# Patient Record
Sex: Male | Born: 1945 | Race: White | Hispanic: No | Marital: Single | Smoking: Current every day smoker
Health system: Southern US, Community
[De-identification: ages and names within clinical notes are randomized; demographics above are authoritative.]

## PROBLEM LIST (undated history)

## (undated) DIAGNOSIS — I10 Essential (primary) hypertension: Secondary | ICD-10-CM

## (undated) DIAGNOSIS — N400 Enlarged prostate without lower urinary tract symptoms: Secondary | ICD-10-CM

## (undated) DIAGNOSIS — R972 Elevated prostate specific antigen [PSA]: Secondary | ICD-10-CM

## (undated) DIAGNOSIS — E785 Hyperlipidemia, unspecified: Secondary | ICD-10-CM

## (undated) DIAGNOSIS — I639 Cerebral infarction, unspecified: Secondary | ICD-10-CM

## (undated) DIAGNOSIS — I679 Cerebrovascular disease, unspecified: Secondary | ICD-10-CM

## (undated) DIAGNOSIS — I6529 Occlusion and stenosis of unspecified carotid artery: Secondary | ICD-10-CM

## (undated) HISTORY — DX: Benign prostatic hyperplasia without lower urinary tract symptoms: N40.0

## (undated) HISTORY — DX: Essential (primary) hypertension: I10

## (undated) HISTORY — DX: Hyperlipidemia, unspecified: E78.5

## (undated) HISTORY — DX: Elevated prostate specific antigen (PSA): R97.20

## (undated) HISTORY — DX: Cerebral infarction, unspecified: I63.9

## (undated) HISTORY — DX: Occlusion and stenosis of unspecified carotid artery: I65.29

## (undated) HISTORY — PX: OTHER SURGICAL HISTORY: SHX169

## (undated) HISTORY — DX: Cerebrovascular disease, unspecified: I67.9

## (undated) HISTORY — PX: PROSTATE SURGERY: SHX751

---

## 1998-05-04 ENCOUNTER — Encounter: Payer: Self-pay | Admitting: Emergency Medicine

## 1998-05-04 ENCOUNTER — Inpatient Hospital Stay (HOSPITAL_COMMUNITY): Admission: EM | Admit: 1998-05-04 | Discharge: 1998-05-08 | Payer: Self-pay | Admitting: Emergency Medicine

## 1998-05-05 ENCOUNTER — Encounter: Payer: Self-pay | Admitting: Emergency Medicine

## 2008-04-28 ENCOUNTER — Ambulatory Visit: Payer: Self-pay | Admitting: Internal Medicine

## 2009-01-30 ENCOUNTER — Inpatient Hospital Stay: Payer: Self-pay | Admitting: Internal Medicine

## 2010-10-21 DIAGNOSIS — N400 Enlarged prostate without lower urinary tract symptoms: Secondary | ICD-10-CM

## 2010-10-21 DIAGNOSIS — I10 Essential (primary) hypertension: Secondary | ICD-10-CM

## 2010-10-21 DIAGNOSIS — I679 Cerebrovascular disease, unspecified: Secondary | ICD-10-CM | POA: Insufficient documentation

## 2010-10-21 DIAGNOSIS — E785 Hyperlipidemia, unspecified: Secondary | ICD-10-CM | POA: Insufficient documentation

## 2010-10-21 HISTORY — DX: Benign prostatic hyperplasia without lower urinary tract symptoms: N40.0

## 2010-10-21 HISTORY — DX: Cerebrovascular disease, unspecified: I67.9

## 2010-10-21 HISTORY — DX: Essential (primary) hypertension: I10

## 2012-06-05 DIAGNOSIS — I6529 Occlusion and stenosis of unspecified carotid artery: Secondary | ICD-10-CM

## 2012-06-05 HISTORY — DX: Occlusion and stenosis of unspecified carotid artery: I65.29

## 2014-09-08 ENCOUNTER — Other Ambulatory Visit: Payer: Self-pay | Admitting: Urology

## 2014-09-08 ENCOUNTER — Other Ambulatory Visit: Payer: Self-pay

## 2014-09-08 DIAGNOSIS — N400 Enlarged prostate without lower urinary tract symptoms: Secondary | ICD-10-CM

## 2014-09-08 MED ORDER — AVODART 0.5 MG PO CAPS: 0.5000 mg | ORAL_CAPSULE | Freq: Every day | ORAL | Status: DC

## 2014-09-08 NOTE — Progress Notes (Signed)
Pt called requesting avodart brand name and not dutasteride generic. Brand name medication was sent to pharmacy.

## 2015-01-27 ENCOUNTER — Telehealth: Payer: Self-pay | Admitting: Urology

## 2015-01-27 NOTE — Telephone Encounter (Signed)
Patient called and left a voice mail on 12/26 requesting a call back regarding his Avodart prescription.

## 2015-01-29 NOTE — Telephone Encounter (Signed)
PA has been sent to pt insurance company.

## 2015-05-11 ENCOUNTER — Encounter: Payer: Self-pay | Admitting: *Deleted

## 2015-05-14 ENCOUNTER — Ambulatory Visit: Payer: Self-pay

## 2015-05-25 ENCOUNTER — Ambulatory Visit (INDEPENDENT_AMBULATORY_CARE_PROVIDER_SITE_OTHER): Payer: Medicare Other | Admitting: Urology

## 2015-05-25 ENCOUNTER — Encounter: Payer: Self-pay | Admitting: Urology

## 2015-05-25 VITALS — BP 132/76 | HR 80 | Ht 68.0 in | Wt 175.0 lb

## 2015-05-25 DIAGNOSIS — Z87898 Personal history of other specified conditions: Secondary | ICD-10-CM | POA: Diagnosis not present

## 2015-05-25 DIAGNOSIS — N401 Enlarged prostate with lower urinary tract symptoms: Secondary | ICD-10-CM

## 2015-05-25 DIAGNOSIS — N138 Other obstructive and reflux uropathy: Secondary | ICD-10-CM

## 2015-05-25 NOTE — Progress Notes (Signed)
05/25/2015 2:01 PM   Barry Hale [birth year] [id number]  Referring provider: No referring provider defined for this encounter.  Chief Complaint  Patient presents with  . Benign Prostatic Hypertrophy    HPI: Patient is a 70 year old Caucasian male with a history of BPH and LUTS and an elevated PSA who presents today for a 1 year follow-up.   BPH WITH LUTS His IPSS score today is 1, which is mild lower urinary tract symptomatology. He is pleased with his quality life due to his urinary symptoms.  He denies any dysuria, hematuria or suprapubic pain.   He has not been taking his Avodart since November 2016, as his insurance would not cover the medication.  He also denies any recent fevers, chills, nausea or vomiting.  He does not have a family history of PCa.      IPSS      05/25/15 1300       International Prostate Symptom Score   How often have you had the sensation of not emptying your bladder? Not at All     How often have you had to urinate less than every two hours? Not at All     How often have you found you stopped and started again several times when you urinated? Not at All     How often have you found it difficult to postpone urination? Not at All     How often have you had a weak urinary stream? Not at All     How often have you had to strain to start urination? Not at All     How many times did you typically get up at night to urinate? 1 Time     Total IPSS Score 1     Quality of Life due to urinary symptoms   If you were to spend the rest of your life with your urinary condition just the way it is now how would you feel about that? Pleased        Score:  1-7 Mild 8-19 Moderate 20-35 Severe  History of elevated PSA Patient underwent a biopsy of the prostate 2012 for a PSA of 7.6 ng/mL. The biopsy was negative.  He had been on dutasteride until November 2016. His most recent PSA was 3.2 ng/mL on 05/15/2014.   PMH: Past Medical History  Diagnosis  Date  . HTN (hypertension)   . HLD (hyperlipidemia)   . Stroke (cerebrum) (HCC)   . BPH (benign prostatic hyperplasia)   . Elevated PSA   . 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 Asymptomatic carotid artery stenosis 06/05/2012  . BP (high blood pressure) 10/21/2010  . 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 Artery blockage    . Prostate surgery      Home Medications:    Medication List       This list is accurate as of: 05/25/15  2:01 PM.  Always use your most recent med list.               amLODipine 5 MG tablet  Commonly known as:  NORVASC     atorvastatin 40 MG tablet  Commonly known as:  LIPITOR  TK 1 T PO QD     KLOR-CON M10 10 MEQ tablet  Generic drug:  potassium chloride     lisinopril-hydrochlorothiazide 20-12.5 MG tablet  Commonly known as:  PRINZIDE,ZESTORETIC  TK 1 T PO QD        Allergies: No Known Allergies  Family History: Family History  Problem Relation Age of Onset  . Benign prostatic hyperplasia    . Coronary artery disease      Social History:  reports that he has been smoking.  He does not have any smokeless tobacco history on file. He reports that he drinks alcohol. His drug history is not on file.  ROS: UROLOGY Frequent Urination?: No Hard to postpone urination?: No Burning/pain with urination?: No Get up at night to urinate?: No Leakage of urine?: No Urine stream starts and stops?: No Trouble starting stream?: No Do you have to strain to urinate?: No Blood in urine?: No Urinary tract infection?: No Sexually transmitted disease?: No Injury to kidneys or bladder?: No Painful intercourse?: No Weak stream?: No Erection problems?: No Penile pain?: No  Gastrointestinal Nausea?: No Vomiting?:  No Indigestion/heartburn?: No Diarrhea?: Yes Constipation?: No  Constitutional Fever: No Night sweats?: No Weight loss?: No Fatigue?: No  Skin Skin rash/lesions?: No Itching?: No  Eyes Blurred vision?: No Double vision?: No  Ears/Nose/Throat Sore throat?: No Sinus problems?: No  Hematologic/Lymphatic Swollen glands?: No Easy bruising?: No  Cardiovascular Leg swelling?: No Chest pain?: No  Respiratory Cough?: No Shortness of breath?: No  Endocrine Excessive thirst?: No  Musculoskeletal Back pain?: No Joint pain?: No  Neurological Headaches?: No Dizziness?: No  Psychologic Depression?: No Anxiety?: No  Physical Exam: BP 132/76 mmHg  Pulse 80  Ht 5\' 8"  (1.727 m)  Wt 175 lb (79.379 kg)  BMI 26.61 kg/m2  Constitutional: Well nourished. Alert and oriented, No acute distress. HEENT: [place] AT, moist mucus membranes. Trachea midline, no masses. Cardiovascular: No clubbing, cyanosis, or edema. Respiratory: Normal respiratory effort, no increased work of breathing. GI: Abdomen is soft, non tender, non distended, no abdominal masses. Liver and spleen not palpable.  No hernias appreciated.  Stool sample for occult testing is not indicated.   GU: No CVA tenderness.  No bladder fullness or masses.  Patient with circumcised phallus.  Urethral meatus is patent.  No penile discharge. No penile lesions or rashes. Scrotum without lesions, cysts, rashes and/or edema.  Testicles are located scrotally bilaterally. No masses are appreciated in the testicles. Left and right epididymis are normal. Rectal: Patient with  normal sphincter tone. Anus and perineum without scarring or rashes. No rectal masses are appreciated. Prostate is approximately 60 grams, no nodules are appreciated. Seminal vesicles are normal. Skin: No rashes, bruises or suspicious lesions. Lymph: No cervical or inguinal adenopathy. Neurologic: Grossly intact, no focal deficits, moving all 4  extremities. Psychiatric: Normal mood and affect.  Laboratory Data: PSA history  7.6 ng/mL 2012 with negative biopsy; initiated on dutasteride at that time  2.6 ng/mL on 05/03/2012  3.0 ng/mL on 11/13/2012  3.4 ng/mL on 05/14/2013  2.9 ng/mL on 11/13/2013  3.2 ng/mL on 05/15/2014; patient has been without dutasteride since 12/2014    Assessment & Plan:    1. BPH (benign prostatic hyperplasia) with LUTS:   IPSS score is 1/1.  Patient would like to defer his refill of dutasteride at this time until his PSA result is available.   If his PSA result remains at baseline, he chooses not to refill the medication and he'll return in 1 year for I PSS score, PSA and exam.   If the PSA returns elevated, we  will restart the dutasteride and recheck his PSA in 3 months.   - PSA  2. History of elevated PSA:   Patient underwent a biopsy of the prostate 2012 for a PSA of 7.6 ng/mL. The biopsy was negative.  He had been on dutasteride until November 2016. His most recent PSA was 3.2 ng/mL on 05/15/2014.  PSA is drawn today.  See above for plan.   Return in about 1 year (around 05/24/2016) for IPSS and exam.  These notes generated with voice recognition software. I apologize for typographical errors.  Michiel Cowboy, PA-C  [facility] [address], [address] [place], [place] [address] [phone]

## 2015-05-26 DIAGNOSIS — N138 Other obstructive and reflux uropathy: Secondary | ICD-10-CM | POA: Insufficient documentation

## 2015-05-26 DIAGNOSIS — Z87898 Personal history of other specified conditions: Secondary | ICD-10-CM | POA: Insufficient documentation

## 2015-05-26 DIAGNOSIS — N401 Enlarged prostate with lower urinary tract symptoms: Principal | ICD-10-CM

## 2015-05-26 LAB — PSA: Prostate Specific Ag, Serum: 3.1 ng/mL (ref 0.0–4.0)

## 2015-05-28 ENCOUNTER — Telehealth: Payer: Self-pay

## 2015-05-28 NOTE — Telephone Encounter (Signed)
-----   Message from Harle BattiestShannon A McGowan, PA-C sent at 05/26/2015  5:00 PM EDT ----- PSA is stable.  We will see him in one year.

## 2015-05-28 NOTE — Telephone Encounter (Signed)
LMOM- recent labs are stable. See in [id number].

## 2016-05-11 ENCOUNTER — Ambulatory Visit (INDEPENDENT_AMBULATORY_CARE_PROVIDER_SITE_OTHER): Payer: Medicare Other | Admitting: Urology

## 2016-05-11 ENCOUNTER — Encounter: Payer: Self-pay | Admitting: Urology

## 2016-05-11 VITALS — BP 85/55 | HR 103 | Ht 67.0 in | Wt 174.3 lb

## 2016-05-11 DIAGNOSIS — N400 Enlarged prostate without lower urinary tract symptoms: Secondary | ICD-10-CM

## 2016-05-11 DIAGNOSIS — R339 Retention of urine, unspecified: Secondary | ICD-10-CM

## 2016-05-11 LAB — BLADDER SCAN AMB NON-IMAGING: Scan Result: 700

## 2016-05-11 MED ORDER — DUTASTERIDE 0.5 MG PO CAPS: 0.5000 mg | ORAL_CAPSULE | Freq: Every day | ORAL | 11 refills | Status: DC

## 2016-05-11 MED ORDER — TAMSULOSIN HCL 0.4 MG PO CAPS: 0.4000 mg | ORAL_CAPSULE | Freq: Every day | ORAL | 11 refills | Status: DC

## 2016-05-11 NOTE — Progress Notes (Signed)
Simple Catheter Placement  Due to urinary retention patient is present today for a foley cath placement.  Patient was cleaned and prepped in a sterile fashion with betadine and lidocaine jelly 2% was instilled into the urethra.  A 16 FR council tip foley catheter was inserted, urine return was noted  700 ml, urine was dark yellow in color.  The balloon was filled with 10cc of sterile water.  A night bag was attached for drainage. Patient was also given a leg bag to take home and was given instruction on how to change from one bag to another.  Patient was given instruction on proper catheter care.  Patient tolerated well, no complications were noted   Preformed by: K.Sunni Richardson,CMA  Additional notes/ Follow up: 1-2 week

## 2016-05-11 NOTE — Progress Notes (Signed)
05/11/2016 2:29 PM   Barry Hale 1945-08-26 [id number]  Referring provider: Fonnie Mu, MD No address on file  No chief complaint on file. URINARY RETENTION  HPI: Pt reports inability to void since 4 AM. He has an urge to void and cannot. No alleviating or aggravating factors. No associated signs or symptoms. Symptoms are persistent and worsening. Bladder scan is around 700 ml.   He has a long h/o BPH. He has a h/o retention around 2010 or 2011 and underwent TUMT with Dr. Orson Slick. He was on Avodart but stopped in 2016 when insurance no longer covered (maybe because it was generic?). He had a biopsy in 2012 but the note is not readily available to determine what his prostate measured.   Patient underwent a biopsy of the prostate 2012 for a PSA of 7.6 ng/mL. The biopsy was negative.  He had been on dutasteride until November 2016. His PSA was 3.2 ng/mL on 05/15/2014 and 3.02 May 2015 with a normal DRE.    PMH: Past Medical History:  Diagnosis Date  . 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 Asymptomatic carotid artery stenosis 06/05/2012  . 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 BP (high blood pressure) 10/21/2010  . BPH (benign prostatic hyperplasia)   . Elevated PSA   . HLD (hyperlipidemia)   . HTN (hypertension)   . Stroke (cerebrum) [facility])     Surgical History: Past Surgical History:  Procedure Laterality Date  . artery blockage    . PROSTATE SURGERY      Home Medications:  Allergies as of 05/11/2016   No Known Allergies     Medication List       Accurate as of 05/11/16  2:29 PM. Always use your most recent med list.          amLODipine 5 MG tablet Commonly known as:  NORVASC   atorvastatin 40 MG tablet Commonly known as:  LIPITOR TK 1 T PO QD   KLOR-CON M10 10 MEQ tablet Generic drug:   potassium chloride   lisinopril-hydrochlorothiazide 20-12.5 MG tablet Commonly known as:  PRINZIDE,ZESTORETIC TK 1 T PO QD       Allergies: No Known Allergies  Family History: Family History  Problem Relation Age of Onset  . Benign prostatic hyperplasia    . Coronary artery disease      Social History:  reports that he has been smoking.  He does not have any smokeless tobacco history on file. He reports that he drinks alcohol. His drug history is not on file.  ROS:                                        Physical Exam: There were no vitals taken for this visit.  Constitutional:  Alert and oriented, No acute distress. HEENT: [place] AT, moist mucus membranes.  Trachea midline, no masses. Cardiovascular: No clubbing, cyanosis, or edema. Respiratory: Normal respiratory effort, no increased work of breathing. GI: Abdomen is soft, nontender, nondistended, no abdominal masses Skin: No rashes, bruises or suspicious lesions. Neurologic: Grossly intact, no focal deficits, moving all 4 extremities. Psychiatric: Normal mood and affect.  Laboratory Data: No results found for: WBC, HGB, HCT, MCV, PLT  No results found for: CREATININE  No results found for:  PSA  No results found for: TESTOSTERONE  No results found for: HGBA1C  Urinalysis No results found for: COLORURINE, APPEARANCEUR, LABSPEC, PHURINE, GLUCOSEU, HGBUR, BILIRUBINUR, KETONESUR, PROTEINUR, UROBILINOGEN, NITRITE, LEUKOCYTESUR  Assessment & Plan:    1) urinary retention - foley placed. F/u 5-6 days for voiding trial.   2) BPH - discussed with patient nature r/b/a to tamsulosin and dutasteride. All questions answered. He elects to start both. F/u later this month for PSA and yearly OV.   There are no diagnoses linked to this encounter.  No Follow-up on file.  Jerilee Field, MD  [facility] [address] [place], [place] [address] [phone]

## 2016-05-12 ENCOUNTER — Telehealth: Payer: Self-pay

## 2016-05-12 NOTE — Telephone Encounter (Signed)
LMOM PA for advodart has been APPROVED!

## 2016-05-16 ENCOUNTER — Ambulatory Visit: Payer: Medicare Other

## 2016-05-16 VITALS — BP 109/69 | HR 76 | Ht 67.0 in | Wt 175.0 lb

## 2016-05-20 ENCOUNTER — Ambulatory Visit: Payer: Medicare Other

## 2016-05-23 ENCOUNTER — Ambulatory Visit (INDEPENDENT_AMBULATORY_CARE_PROVIDER_SITE_OTHER): Payer: Medicare Other

## 2016-05-23 VITALS — BP 115/71 | HR 81 | Ht 67.0 in | Wt 178.0 lb

## 2016-05-23 DIAGNOSIS — R339 Retention of urine, unspecified: Secondary | ICD-10-CM

## 2016-05-23 NOTE — Progress Notes (Signed)
Fill and Pull Catheter Removal  Patient is present today for a catheter removal.  Patient was cleaned and prepped in a sterile fashion of sterile water/ saline was instilled into the bladder when the patient felt the urge to urinate. 10ml of water was then drained from the balloon.  A 16FR foley cath was removed from the bladder no complications were noted .  Patient as then given some time to void on their own.  Patient can void  70ml on their own after some time.  Patient tolerated well.  Preformed by: Rupert Stacks, LPN   Follow up/ Additional notes: Reinforced with pt if not able to urinate to RTC today by 3. Pt voiced understanding.  Blood pressure 115/71, pulse 81, height  (1.702 m), weight 178 lb (80.7 kg).

## 2016-05-24 ENCOUNTER — Ambulatory Visit: Payer: TRICARE For Life (TFL) | Admitting: Urology

## 2016-05-29 ENCOUNTER — Emergency Department
Admission: EM | Admit: 2016-05-29 | Discharge: 2016-05-29 | Disposition: A | Discharge status: Home / Self Care | Payer: Medicare Other | Attending: Emergency Medicine | Admitting: Emergency Medicine

## 2016-05-29 ENCOUNTER — Ambulatory Visit
Admission: EM | Admit: 2016-05-29 | Discharge: 2016-05-29 | Disposition: A | Discharge status: Home / Self Care | Payer: TRICARE For Life (TFL)

## 2016-05-29 ENCOUNTER — Encounter: Payer: Self-pay | Admitting: Emergency Medicine

## 2016-05-29 DIAGNOSIS — R339 Retention of urine, unspecified: Secondary | ICD-10-CM | POA: Diagnosis present

## 2016-05-29 DIAGNOSIS — N390 Urinary tract infection, site not specified: Secondary | ICD-10-CM

## 2016-05-29 DIAGNOSIS — F17200 Nicotine dependence, unspecified, uncomplicated: Secondary | ICD-10-CM | POA: Insufficient documentation

## 2016-05-29 DIAGNOSIS — Z7982 Long term (current) use of aspirin: Secondary | ICD-10-CM | POA: Insufficient documentation

## 2016-05-29 DIAGNOSIS — I10 Essential (primary) hypertension: Secondary | ICD-10-CM | POA: Diagnosis not present

## 2016-05-29 DIAGNOSIS — Z79899 Other long term (current) drug therapy: Secondary | ICD-10-CM | POA: Insufficient documentation

## 2016-05-29 LAB — URINALYSIS, COMPLETE (UACMP) WITH MICROSCOPIC
BACTERIA UA: NONE SEEN
BILIRUBIN URINE: NEGATIVE
Glucose, UA: NEGATIVE mg/dL
Ketones, ur: NEGATIVE mg/dL
NITRITE: NEGATIVE
PROTEIN: NEGATIVE mg/dL
SQUAMOUS EPITHELIAL / LPF: NONE SEEN
Specific Gravity, Urine: 1.004 — ABNORMAL LOW (ref 1.005–1.030)
pH: 6 (ref 5.0–8.0)

## 2016-05-29 MED ORDER — LIDOCAINE HCL 2 % EX GEL
1.0000 "application " | Freq: Once | CUTANEOUS | Status: AC
  Administered 2016-05-29: 1 via URETHRAL
  Filled 2016-05-29: qty 5

## 2016-05-29 MED ORDER — CIPROFLOXACIN HCL 500 MG PO TABS: 500.0000 mg | ORAL_TABLET | Freq: Two times a day (BID) | ORAL | 0 refills | Status: AC

## 2016-05-29 NOTE — Discharge Instructions (Signed)
Wear the Foley catheter. Call your urologist or Dr. Apolinar Junes for follow-up appointment within about a week. They may be able to take the catheter out once infection is treated. Please return here for fever pain vomiting feeling sicker at all. Take the Cipro one pill twice a day. Remember what I told you about returning here if you get pain in the arms or legs while you're taking the Cipro.

## 2016-05-29 NOTE — ED Notes (Signed)
Placed leg bag on patient for dsicharge

## 2016-05-29 NOTE — ED Triage Notes (Signed)
Pt states woke up this morning and has been unable to void since this morning. Pt states "I was just seen up here for this a couple of weeks ago and I had a blockage". Pt states this morning when attempted to urinate he was only able to "dribble", and "it feels like glass in my penis". Pt has hx of prostate problems.

## 2016-05-29 NOTE — ED Provider Notes (Signed)
[facility] Emergency Department Provider Note   ____________________________________________   None    (approximate)  I have reviewed the triage vital signs and the nursing notes.   HISTORY  Chief Complaint Urinary Retention   HPI Barry Hale is a 71 y.o. male who says he woke up this morning and can't void. He can only dribble. And it hurts like he's been glass when he does so. As a history of prostate problems. He has no other complaints at this moment. The pain is severe and he feels like he is going to explode. Nothing makes it better or worse. He is unable to dribble enough to make a difference.  Past Medical History:  Diagnosis Date  . 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 Asymptomatic carotid artery stenosis 06/05/2012  . 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 BP (high blood pressure) 10/21/2010  . BPH (benign prostatic hyperplasia)   . Elevated PSA   . HLD (hyperlipidemia)   . HTN (hypertension)   . Stroke (cerebrum) [facility])     Patient Active Problem List   Diagnosis Date Noted  . BPH with obstruction/lower urinary tract symptoms 05/26/2015  . History of elevated PSA 05/26/2015  . Asymptomatic carotid artery stenosis 06/05/2012  . 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 Artery disease, cerebral 10/21/2010  . BP (high blood pressure) 10/21/2010  . HLD (hyperlipidemia) 10/21/2010    Past Surgical History:  Procedure Laterality Date  . artery blockage    . PROSTATE SURGERY      Prior to Admission medications   Medication Sig Start Date End Date Taking? Authorizing Provider  amLODipine (NORVASC) 10 MG tablet Take 10 mg by mouth daily.  03/24/15  Yes Historical Provider, MD  aspirin 325 MG tablet Take 325 mg by mouth every 6 (six) hours as needed.   Yes Historical Provider,  MD  atorvastatin (LIPITOR) 40 MG tablet TK 1 T PO QD 04/20/15  Yes Historical Provider, MD  dutasteride (AVODART) 0.5 MG capsule Take 1 capsule (0.5 mg total) by mouth daily. 05/11/16  Yes Jerilee Field, MD  KLOR-CON M10 10 MEQ tablet Take 10 mEq by mouth every evening.  05/19/15  Yes Historical Provider, MD  lisinopril-hydrochlorothiazide (PRINZIDE,ZESTORETIC) 20-12.5 MG tablet TK 1 T PO QD 04/20/15  Yes Historical Provider, MD  tamsulosin (FLOMAX) 0.4 MG CAPS capsule Take 1 capsule (0.4 mg total) by mouth daily after supper. 05/11/16  Yes Jerilee Field, MD  ciprofloxacin (CIPRO) 500 MG tablet Take 1 tablet (500 mg total) by mouth 2 (two) times daily. 05/29/16 06/08/16  Arnaldo Natal, MD    Allergies Patient has no known allergies.  Family History  Problem Relation Age of Onset  . Benign prostatic hyperplasia    . Coronary artery disease      Social History Social History  Substance Use Topics  . Smoking status: Current Every Day Smoker  . Smokeless tobacco: Never Used  . Alcohol use 0.0 oz/week    Review of Systems  Constitutional: No fever/chills Eyes: No visual changes. ENT: No sore throat. Cardiovascular: Denies chest pain. Respiratory: Denies shortness of breath. Gastrointestinal: No abdominal pain.  No nausea, no vomiting.  No diarrhea.  No constipation. Genitourinary: dysuria. Musculoskeletal: Negative for back pain. Skin: Negative for rash. Neurological: Negative for headaches, focal weakness or numbness.   ____________________________________________   PHYSICAL  EXAM:  VITAL SIGNS: ED Triage Vitals  Enc Vitals Group     BP 05/29/16 1225 105/73     Pulse Rate 05/29/16 1225 95     Resp 05/29/16 1225 18     Temp 05/29/16 1225 97.7 F (36.5 C)     Temp Source 05/29/16 1225 Oral     SpO2 05/29/16 1225 95 %     Weight 05/29/16 1227 175 lb (79.4 kg)     Height 05/29/16 1227  (1.727 m)     Head Circumference --      Peak Flow --      Pain Score 05/29/16  1224 10     Pain Loc --      Pain Edu? --      Excl. in GC? --     Constitutional: Alert and oriented. Well appearing and in acute distress. Eyes: Conjunctivae are normal. PERRL. EOMI. Head: Atraumatic. Nose: No congestion/rhinnorhea. Mouth/Throat: Mucous membranes are moist.  Oropharynx non-erythematous. Neck: No stridor.   Cardiovascular: Normal rate, regular rhythm.  Respiratory: Normal respiratory effort.  No retractions.  Gastrointestinal: Soft and nontender except for suprapubically. No distention. Marland Kitchen No CVA tenderness. Genitourinary:  Musculoskeletal: No lower extremity tenderness nor edema.  No joint effusions. Neurologic:  Normal speech and language. No gross focal neurologic deficits are appreciated. No gait instability. Skin:  Skin is warm, dry and intact. No rash noted. Psychiatric: Mood and affect are normal. Speech and behavior are normal.  ____________________________________________   LABS (all labs ordered are listed, but only abnormal results are displayed)  Labs Reviewed  URINALYSIS, COMPLETE (UACMP) WITH MICROSCOPIC - Abnormal; Notable for the following:       Result Value   Color, Urine YELLOW (*)    APPearance CLEAR (*)    Specific Gravity, Urine 1.004 (*)    Hgb urine dipstick SMALL (*)    Leukocytes, UA TRACE (*)    All other components within normal limits   ____________________________________________  EKG  ____________________________________________  RADIOLOGY   ____________________________________________   PROCEDURES  Procedure(s) performed:   Procedures  Critical Care performed:   ____________________________________________   INITIAL IMPRESSION / ASSESSMENT AND PLAN / ED COURSE  Pertinent labs & imaging results that were available during my care of the patient were reviewed by me and considered in my medical decision making (see chart for details).   After Foley is present and patient feels well.      ____________________________________________   FINAL CLINICAL IMPRESSION(S) / ED DIAGNOSES  Final diagnoses:  Urinary retention  Urinary tract infection without hematuria, site unspecified      NEW MEDICATIONS STARTED DURING THIS VISIT:  New Prescriptions   CIPROFLOXACIN (CIPRO) 500 MG TABLET    Take 1 tablet (500 mg total) by mouth 2 (two) times daily.     Note:  This document was prepared using Dragon voice recognition software and may include unintentional dictation errors.    Arnaldo Natal, MD 05/29/16 1357

## 2016-05-30 ENCOUNTER — Telehealth: Payer: Self-pay

## 2016-05-30 NOTE — Telephone Encounter (Signed)
Pt called stating he was seen in the ER over the weekend and a foley was placed. Per pt 700cc was drained from bladder. Pt also stated that he was started on cipro. Pt has an appt with Dr. Mena Goes on 5/23. Pt voiced concern of keeping a foley until appt and abx/. Reinforced with pt its ok to keep foley and probably should due to hx of retention. Reinforced with pt at appt with Dr. Mena Goes foley and further tx will be discussed. Also reinforced with pt to complete 7 day course of cipro and should not need anymore once course is complete. Pt voiced understanding of whole conversation.

## 2016-06-12 ENCOUNTER — Emergency Department
Admission: EM | Admit: 2016-06-12 | Discharge: 2016-06-12 | Disposition: A | Discharge status: Home / Self Care | Payer: Medicare Other | Attending: Student in an Organized Health Care Education/Training Program | Admitting: Student in an Organized Health Care Education/Training Program

## 2016-06-12 ENCOUNTER — Encounter: Payer: Self-pay | Admitting: Intensive Care

## 2016-06-12 DIAGNOSIS — F17290 Nicotine dependence, other tobacco product, uncomplicated: Secondary | ICD-10-CM | POA: Diagnosis not present

## 2016-06-12 DIAGNOSIS — Z7982 Long term (current) use of aspirin: Secondary | ICD-10-CM | POA: Insufficient documentation

## 2016-06-12 DIAGNOSIS — I10 Essential (primary) hypertension: Secondary | ICD-10-CM | POA: Diagnosis not present

## 2016-06-12 DIAGNOSIS — L989 Disorder of the skin and subcutaneous tissue, unspecified: Secondary | ICD-10-CM | POA: Diagnosis not present

## 2016-06-12 DIAGNOSIS — Z466 Encounter for fitting and adjustment of urinary device: Secondary | ICD-10-CM | POA: Diagnosis present

## 2016-06-12 NOTE — ED Triage Notes (Addendum)
Patient presents to ER with foley catheter in place that started leaking throughout the night due to the bag breaking at the tip. Patient is here to get a new bag. Denies pain. Ambulatory in triage with no problems. Appointment set up to have catheter removed on the 23rd.

## 2016-06-12 NOTE — ED Provider Notes (Signed)
[facility] Emergency Department Provider Note  ____________________________________________  Time seen: Approximately 11:43 AM  I have reviewed the triage vital signs and the nursing notes.   HISTORY  Chief Complaint No chief complaint on file.    HPI Barry Hale is a 71 y.o. male presents to emergency department for leaking catheter bag.Patient had catheter placed on April 29 for urinary retention. He has an appointment to have catheter removed on Wednesday. He has no other questions or concerns today. He denies shortness of breath, CP, nausea, vomiting, abdominal pain   Past Medical History:  Diagnosis Date  . 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 Asymptomatic carotid artery stenosis 06/05/2012  . 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 BP (high blood pressure) 10/21/2010  . BPH (benign prostatic hyperplasia)   . Elevated PSA   . HLD (hyperlipidemia)   . HTN (hypertension)   . Stroke (cerebrum) [facility](HCC)     Patient Active Problem List   Diagnosis Date Noted  . BPH with obstruction/lower urinary tract symptoms 05/26/2015  . History of elevated PSA 05/26/2015  . Asymptomatic carotid artery stenosis 06/05/2012  . 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 Artery disease, cerebral 10/21/2010  . BP (high blood pressure) 10/21/2010  . HLD (hyperlipidemia) 10/21/2010    Past Surgical History:  Procedure Laterality Date  . artery blockage    . PROSTATE SURGERY      Prior to Admission medications   Medication Sig Start Date End Date Taking? Authorizing Provider  amLODipine (NORVASC) 10 MG tablet Take 10 mg by mouth daily.  03/24/15   [provider]  aspirin 325 MG tablet Take 325 mg by mouth every 6 (six) hours as needed.    [provider]  atorvastatin (LIPITOR) 40 MG  tablet TK 1 T PO QD 04/20/15   [provider]  dutasteride (AVODART) 0.5 MG capsule Take 1 capsule (0.5 mg total) by mouth daily. 05/11/16   Jerilee FieldEskridge, Matthew, MD  KLOR-CON M10 10 MEQ tablet Take 10 mEq by mouth every evening.  05/19/15   [provider]  lisinopril-hydrochlorothiazide (PRINZIDE,ZESTORETIC) 20-12.5 MG tablet TK 1 T PO QD 04/20/15   [provider]  tamsulosin (FLOMAX) 0.4 MG CAPS capsule Take 1 capsule (0.4 mg total) by mouth daily after supper. 05/11/16   Jerilee FieldEskridge, Matthew, MD    Allergies Patient has no known allergies.  Family History  Problem Relation Age of Onset  . Benign prostatic hyperplasia Unknown   . Coronary artery disease Unknown     Social History Social History  Substance Use Topics  . Smoking status: Current Every Day Smoker    Types: Pipe  . Smokeless tobacco: Never Used  . Alcohol use 4.2 oz/week    7 Cans of beer per week     Comment: one a day per patient     Review of Systems  Constitutional: No fever/chills ENT: No upper respiratory complaints. Cardiovascular: No chest pain. Respiratory: No SOB. Gastrointestinal: No abdominal pain.  No nausea, no vomiting.  Musculoskeletal: Negative for musculoskeletal pain. Skin: Negative for rash, abrasions, lacerations, ecchymosis.   ____________________________________________   PHYSICAL EXAM:  VITAL SIGNS: ED Triage Vitals [06/12/16 1102]  Enc Vitals Group     BP 110/65     Pulse Rate 83     Resp 18     Temp 98.3  F (36.8 C)     Temp Source Oral     SpO2 97 %     Weight 175 lb (79.4 kg)     Height 5\' 8"  (1.727 m)     Head Circumference      Peak Flow      Pain Score      Pain Loc      Pain Edu?      Excl. in GC?      Constitutional: Alert and oriented. Well appearing and in no acute distress. Eyes: Conjunctivae are normal. PERRL. EOMI. Head: Atraumatic. ENT:      Ears:      Nose: No congestion/rhinnorhea.      Mouth/Throat: Mucous membranes are  moist.  Neck: No stridor.   Cardiovascular: Normal rate, regular rhythm.  Good peripheral circulation. Respiratory: Normal respiratory effort without tachypnea or retractions. Lungs CTAB. Good air entry to the bases with no decreased or absent breath sounds. Musculoskeletal: Full range of motion to all extremities. No gross deformities appreciated. Neurologic:  Normal speech and language. No gross focal neurologic deficits are appreciated.  Skin:  Skin is warm, dry and intact. 2 cm irregularly shaped brown patch to scalp.    ____________________________________________   LABS (all labs ordered are listed, but only abnormal results are displayed)  Labs Reviewed - No data to display ____________________________________________  EKG   ____________________________________________  RADIOLOGY  No results found.  ____________________________________________    PROCEDURES  Procedure(s) performed:    Procedures    Medications - No data to display   ____________________________________________   INITIAL IMPRESSION / ASSESSMENT AND PLAN / ED COURSE  Pertinent labs & imaging results that were available during my care of the patient were reviewed by me and considered in my medical decision making (see chart for details).  Review of the [place] CSRS was performed in accordance of the [facility] prior to dispensing any controlled drugs.   Patient's diagnosis is consistent with a leaking leg bag. Vital signs and exam are reassuring. Leg bag was changed in ED. He has an appointment on Wednesday to have it removed. On exam patient was noted to have a skin lesion to scalp. Patient states that it was biopsied 8 years ago and he has not seen anyone for it since. He is not sure if it is changing in size or color. He will be referred to dermatology. Patient is given ED precautions to return to the ED for any worsening or new symptoms.    ____________________________________________  FINAL  CLINICAL IMPRESSION(S) / ED DIAGNOSES  Final diagnoses:  Scalp lesion  Encounter for urinary catheter      NEW MEDICATIONS STARTED DURING THIS VISIT:  Discharge Medication List as of 06/12/2016 11:32 AM          This chart was dictated using voice recognition software/Dragon. Despite best efforts to proofread, errors can occur which can change the meaning. Any change was purely unintentional.    Enid Derry, PA-C 06/12/16 1149    Willy Eddy, MD 06/12/16 [phone]

## 2016-06-12 NOTE — ED Notes (Signed)
Changed the patient's urine bag out because his leg bag was leaking at the port.

## 2016-06-22 ENCOUNTER — Ambulatory Visit (INDEPENDENT_AMBULATORY_CARE_PROVIDER_SITE_OTHER): Payer: Medicare Other | Admitting: Urology

## 2016-06-22 ENCOUNTER — Encounter: Payer: Self-pay | Admitting: Urology

## 2016-06-22 VITALS — BP 110/71 | HR 80 | Ht 68.0 in | Wt 175.3 lb

## 2016-06-22 DIAGNOSIS — R339 Retention of urine, unspecified: Secondary | ICD-10-CM

## 2016-06-22 DIAGNOSIS — N138 Other obstructive and reflux uropathy: Secondary | ICD-10-CM

## 2016-06-22 DIAGNOSIS — N401 Enlarged prostate with lower urinary tract symptoms: Secondary | ICD-10-CM | POA: Diagnosis not present

## 2016-06-22 DIAGNOSIS — R972 Elevated prostate specific antigen [PSA]: Secondary | ICD-10-CM | POA: Diagnosis not present

## 2016-06-22 NOTE — Progress Notes (Signed)
06/22/2016 1:40 PM   Barry Hale 27-Jan-1946 [id number]  Referring provider: Fonnie MuHalpern, David, MD No address on file  Chief Complaint  Patient presents with  . Urinary Retention    HPI: F/u -  1) urinary retention - Patient presented 05/11/2016 with inability to void. Foley catheter was placed. He had a similar episode about 7 years ago.   2) BPH - patient reports a long history of BPH and underwent TUMT with Dr. Orson SlickHarmon about 7 years ago. He was on Avodart but stopped that in 2016. He restarted dutasteride April 2018 and we added tamsulosin.  3) Elevated PSA - Patient underwent a biopsy of the prostate 2012 for a PSA of 7.6 ng/mL. The biopsy was negative. He had been on dutasteride until November 2016. His PSA was 3.2 ng/mL Apr 2016 and 3.02 May 2015 with a normal DRE.   Patient returns for management of the above. He failed a voiding trial and the Foley was replaced. His DRE was normal today. We discussed the nature r/b of cystoscopy and another void trial and he elected to proceed.   PMH: Past Medical History:  Diagnosis Date  . 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 Asymptomatic carotid artery stenosis 06/05/2012  . 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 BP (high blood pressure) 10/21/2010  . BPH (benign prostatic hyperplasia)   . Elevated PSA   . HLD (hyperlipidemia)   . HTN (hypertension)   . Stroke (cerebrum) [facility](HCC)     Surgical History: Past Surgical History:  Procedure Laterality Date  . artery blockage    . PROSTATE SURGERY      Home Medications:  Allergies as of 06/22/2016   No Known Allergies     Medication List       Accurate as of 06/22/16  1:40 PM. Always use your most recent med list.          amLODipine 10 MG tablet Commonly known as:  NORVASC Take 10 mg by mouth daily.   aspirin  325 MG tablet Take 325 mg by mouth every 6 (six) hours as needed.   atorvastatin 40 MG tablet Commonly known as:  LIPITOR TK 1 T PO QD   dutasteride 0.5 MG capsule Commonly known as:  AVODART Take 1 capsule (0.5 mg total) by mouth daily.   KLOR-CON M10 10 MEQ tablet Generic drug:  potassium chloride Take 10 mEq by mouth every evening.   lisinopril-hydrochlorothiazide 20-12.5 MG tablet Commonly known as:  PRINZIDE,ZESTORETIC TK 1 T PO QD   tamsulosin 0.4 MG Caps capsule Commonly known as:  FLOMAX Take 1 capsule (0.4 mg total) by mouth daily after supper.       Allergies: No Known Allergies  Family History: Family History  Problem Relation Age of Onset  . Benign prostatic hyperplasia Unknown   . Coronary artery disease Unknown     Social History:  reports that he has been smoking Pipe.  He has never used smokeless tobacco. He reports that he drinks about 4.2 oz of alcohol per week . He reports that he does not use drugs.  ROS:                                        Physical Exam: There were no  vitals taken for this visit.  Constitutional:  Alert and oriented, No acute distress. HEENT: [place] AT, moist mucus membranes.  Trachea midline, no masses. Cardiovascular: No clubbing, cyanosis, or edema. Respiratory: Normal respiratory effort, no increased work of breathing. GI: Abdomen is soft, nontender, nondistended, no abdominal masses GU: No CVA tenderness.  Urine clear DRE - prostate 40 g, no hard area or nodule  Skin: No rashes, bruises or suspicious lesions. Lymph: No cervical or inguinal adenopathy. Neurologic: Grossly intact, no focal deficits, moving all 4 extremities. Psychiatric: Normal mood and affect.  Cystoscopy Procedure Note  Patient identification was confirmed, informed consent was obtained, and patient was prepped using Betadine solution.  Lidocaine jelly was administered per urethral meatus.    Preoperative abx where received  prior to procedure.  Cephalexin 500 mg po x 1.    Pre-Procedure: - Inspection reveals a normal caliber ureteral meatus.  Procedure: The flexible cystoscope was introduced without difficulty - No urethral strictures/lesions are present. - Prostate - R > L lateral lobe regrowth. No median lobe.  - Patent/open bladder neck - Bilateral ureteral orifices identified - Bladder mucosa  reveals no ulcers, tumors, or lesions - No bladder stones - No trabeculation  Retroflexion shows prostate protruding into bladder. He was filled to 300 mL and voided 225 mL.    Post-Procedure: - Patient tolerated the procedure well  Assessment/ Plan: Urinalysis    Component Value Date/Time   COLORURINE YELLOW (A) 05/29/2016 1325   APPEARANCEUR CLEAR (A) 05/29/2016 1325   LABSPEC 1.004 (L) 05/29/2016 1325   PHURINE 6.0 05/29/2016 1325   GLUCOSEU NEGATIVE 05/29/2016 1325   HGBUR SMALL (A) 05/29/2016 1325   BILIRUBINUR NEGATIVE 05/29/2016 1325   KETONESUR NEGATIVE 05/29/2016 1325   PROTEINUR NEGATIVE 05/29/2016 1325   NITRITE NEGATIVE 05/29/2016 1325   LEUKOCYTESUR TRACE (A) 05/29/2016 1325     Assessment & Plan:   1)  Urinary retention - void trial successful. Cysto revealed lateral lobe regrowth -- He'd be a good TURP residual or thulium laser vaporization candidate. Patient interested in procedure especially if he goes back into retention. We discussed the nature risks benefits and alternatives to the thulium laser and risks of bleeding, stricture, incontinence among others.   2) BPH - continue tamsulosin and dutasteride  3) elevated PSA - normal DRE today.   See back in a couple of weeks for a PVR and then 3 mo with a PSA.   There are no diagnoses linked to this encounter.  No Follow-up on file.  Jerilee Field, MD  [facility] [address] [place], [place] [address] [phone]

## 2016-07-06 NOTE — Progress Notes (Signed)
07/07/2016 4:18 PM   Barry Hale 1945-02-26 [id number]  Referring provider: Fonnie Mu, MD No address on file  Chief Complaint  Patient presents with  . Urinary Retention    2 week follow up  . Benign Prostatic Hypertrophy    HPI: F/u -  1) urinary retention - Patient presented 05/11/2016 with inability to void. Foley catheter was placed. He had a similar episode about 7 years ago.   2) BPH - patient reports a long history of BPH and underwent TUMT with Dr. Leonette Hale about 7 years ago. He was on Avodart but stopped that in 2016. He restarted dutasteride April 2018 and we added tamsulosin.  I PSS scoring is 6/4.    3) Elevated PSA - Patient underwent a biopsy of the prostate 2012 for a PSA of 7.6 ng/mL. The biopsy was negative. He had been on dutasteride until November 2016. His PSA was 3.2 ng/mL Apr 2016 and 3.02 May 2015 with a normal DRE.       IPSS    Row Name 07/07/16 1600         International Prostate Symptom Score   How often have you had the sensation of not emptying your bladder? Less than 1 in 5     How often have you had to urinate less than every two hours? Less than 1 in 5 times     How often have you found you stopped and started again several times when you urinated? Less than 1 in 5 times     How often have you found it difficult to postpone urination? Not at All     How often have you had a weak urinary stream? Less than 1 in 5 times     How often have you had to strain to start urination? Not at All     How many times did you typically get up at night to urinate? 2 Times     Total IPSS Score 6       Quality of Life due to urinary symptoms   If you were to spend the rest of your life with your urinary condition just the way it is now how would you feel about that? Mostly Disatisfied        Score:  1-7 Mild 8-19 Moderate 20-35 Severe    PMH: Past Medical History:  Diagnosis Date  . Artery disease, cerebral 10/21/2010   Overview:  CVA  in 2000 with left hemiparesis (resolved). 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 Asymptomatic carotid artery stenosis 06/05/2012  . 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 BP (high blood pressure) 10/21/2010  . BPH (benign prostatic hyperplasia)   . Elevated PSA   . HLD (hyperlipidemia)   . HTN (hypertension)   . Stroke (cerebrum) [facility])     Surgical History: Past Surgical History:  Procedure Laterality Date  . artery blockage    . PROSTATE SURGERY      Home Medications:  Allergies as of 07/07/2016   No Known Allergies     Medication List       Accurate as of 07/07/16  4:18 PM. Always use your most recent med list.          amLODipine 10 MG tablet Commonly known as:  NORVASC Take 10 mg by mouth daily.   aspirin 325 MG tablet Take 325 mg by mouth every 6 (six) hours as needed.   atorvastatin 40 MG tablet  Commonly known as:  LIPITOR TK 1 T PO QD   dutasteride 0.5 MG capsule Commonly known as:  AVODART Take 1 capsule (0.5 mg total) by mouth daily.   KLOR-CON M10 10 MEQ tablet Generic drug:  potassium chloride Take 10 mEq by mouth every evening.   lisinopril-hydrochlorothiazide 20-12.5 MG tablet Commonly known as:  PRINZIDE,ZESTORETIC TK 1 T PO QD   tamsulosin 0.4 MG Caps capsule Commonly known as:  FLOMAX Take 1 capsule (0.4 mg total) by mouth daily after supper.       Allergies: No Known Allergies  Family History: Family History  Problem Relation Age of Onset  . Benign prostatic hyperplasia Unknown   . Coronary artery disease Unknown   . Prostate cancer Neg Hx   . Kidney cancer Neg Hx   . Bladder Cancer Neg Hx     Social History:  reports that he has been smoking Pipe.  He has never used smokeless tobacco. He reports that he drinks about 4.2 oz of alcohol per week . He reports that he does not use drugs.  ROS: UROLOGY Frequent Urination?: No Hard to postpone urination?: No Burning/pain  with urination?: No Get up at night to urinate?: No Leakage of urine?: No Urine stream starts and stops?: No Trouble starting stream?: No Do you have to strain to urinate?: No Blood in urine?: No Urinary tract infection?: No Sexually transmitted disease?: No Injury to kidneys or bladder?: No Painful intercourse?: No Weak stream?: No Erection problems?: No Penile pain?: No  Gastrointestinal Nausea?: No Vomiting?: No Indigestion/heartburn?: No Diarrhea?: No Constipation?: No  Constitutional Fever: No Night sweats?: No Weight loss?: No Fatigue?: No  Skin Skin rash/lesions?: No Itching?: No  Eyes Blurred vision?: No Double vision?: No  Ears/Nose/Throat Sore throat?: No Sinus problems?: No  Hematologic/Lymphatic Swollen glands?: No Easy bruising?: No  Cardiovascular Leg swelling?: No Chest pain?: No  Respiratory Cough?: No Shortness of breath?: No  Endocrine Excessive thirst?: No  Musculoskeletal Back pain?: No Joint pain?: No  Neurological Headaches?: No Dizziness?: No  Psychologic Depression?: No Anxiety?: No  Physical Exam: BP 108/68   Pulse 81   Ht 5\' 8"  (1.727 m)   Wt 175 lb 4.8 oz (79.5 kg)   BMI 26.65 kg/m   Constitutional:  Alert and oriented, No acute distress. HEENT: [place] AT, moist mucus membranes.  Trachea midline, no masses. Cardiovascular: No clubbing, cyanosis, or edema. Respiratory: Normal respiratory effort, no increased work of breathing. Skin: No rashes, bruises or suspicious lesions. Lymph: No cervical or inguinal adenopathy. Neurologic: Grossly intact, no focal deficits, moving all 4 extremities. Psychiatric: Normal mood and affect.  Pertinent Imaging Results for Barry Hale, Barry Hale (MRN [id number]) as of 07/07/2016 16:21  Ref. Range 07/07/2016 16:13  Scan Result Unknown 105    Assessment & Plan:   1)  Urinary retention - void trial successful. Cysto revealed lateral lobe regrowth -- He'd be a good TURP residual or  thulium laser vaporization candidate. Patient interested in procedure especially if he goes back into retention. We discussed the nature risks benefits and alternatives to the thulium laser and risks of bleeding, stricture, incontinence among others.   2) BPH - continue tamsulosin and dutasteride  3) elevated PSA - normal DRE   See back in a couple of weeks for a PVR and then 3 mo with a PSA.     Return in about 3 months (around 10/07/2016) for IPSS, PSA and exam.  Michiel CowboySHANNON Stacee Earp, [facility]  [facility] [address],  [place], [place] [address] [phone]

## 2016-07-07 ENCOUNTER — Ambulatory Visit (INDEPENDENT_AMBULATORY_CARE_PROVIDER_SITE_OTHER): Payer: Medicare Other | Admitting: Urology

## 2016-07-07 ENCOUNTER — Encounter: Payer: Self-pay | Admitting: Urology

## 2016-07-07 VITALS — BP 108/68 | HR 81 | Ht 68.0 in | Wt 175.3 lb

## 2016-07-07 DIAGNOSIS — N401 Enlarged prostate with lower urinary tract symptoms: Secondary | ICD-10-CM

## 2016-07-07 DIAGNOSIS — R972 Elevated prostate specific antigen [PSA]: Secondary | ICD-10-CM | POA: Diagnosis not present

## 2016-07-07 DIAGNOSIS — N138 Other obstructive and reflux uropathy: Secondary | ICD-10-CM

## 2016-07-07 DIAGNOSIS — R339 Retention of urine, unspecified: Secondary | ICD-10-CM | POA: Diagnosis not present

## 2016-07-07 LAB — BLADDER SCAN AMB NON-IMAGING: Scan Result: 105

## 2016-10-04 ENCOUNTER — Other Ambulatory Visit: Payer: Medicare Other

## 2016-10-04 DIAGNOSIS — N138 Other obstructive and reflux uropathy: Secondary | ICD-10-CM

## 2016-10-04 DIAGNOSIS — N401 Enlarged prostate with lower urinary tract symptoms: Principal | ICD-10-CM

## 2016-10-05 LAB — PSA: Prostate Specific Ag, Serum: 6.7 ng/mL — ABNORMAL HIGH (ref 0.0–4.0)

## 2016-10-05 NOTE — Progress Notes (Signed)
10/06/2016 4:19 PM   Barry Hale February 09, 1945 [id number]  Referring provider: Fonnie Mu, MD No address on file  Chief Complaint  Patient presents with  . Urinary Retention    3 month follow up  . Benign Prostatic Hypertrophy    HPI: 71 yo WM with a history of urinary retention, BPH and LU TS and an elevated PSA who presents today for a 3 month follow up.  1) urinary retention - Patient presented 05/11/2016 with inability to void. Foley catheter was placed. He had a similar episode about 7 years ago.   2) BPH - patient reports a long history of BPH and underwent TUMT with Dr. Leonette Monarch about 7 years ago. He was on Avodart but stopped that in 2016. He restarted dutasteride April 2018 and we added tamsulosin.  Current I PSS score 2/1.  Previous I PSS scoring is 6/4.    3) Elevated PSA - Patient underwent a biopsy of the prostate 2012 for a PSA of 7.6 ng/mL. The biopsy was negative. He had been on dutasteride until November 2016. His PSA was 3.2 ng/mL Apr 2016 and 3.02 May 2015 with a normal DRE.       IPSS    Row Name 10/06/16 1500         International Prostate Symptom Score   How often have you had the sensation of not emptying your bladder? Not at All     How often have you had to urinate less than every two hours? Not at All     How often have you found you stopped and started again several times when you urinated? Less than 1 in 5 times     How often have you found it difficult to postpone urination? Not at All     How often have you had a weak urinary stream? Not at All     How often have you had to strain to start urination? Not at All     How many times did you typically get up at night to urinate? 1 Time     Total IPSS Score 2       Quality of Life due to urinary symptoms   If you were to spend the rest of your life with your urinary condition just the way it is now how would you feel about that? Pleased        Score:  1-7 Mild 8-19 Moderate 20-35  Severe    PMH: Past Medical History:  Diagnosis Date  . 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 Asymptomatic carotid artery stenosis 06/05/2012  . 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 BP (high blood pressure) 10/21/2010  . BPH (benign prostatic hyperplasia)   . Elevated PSA   . HLD (hyperlipidemia)   . HTN (hypertension)   . Stroke (cerebrum) [facility])     Surgical History: Past Surgical History:  Procedure Laterality Date  . artery blockage    . PROSTATE SURGERY      Home Medications:  Allergies as of 10/06/2016   No Known Allergies     Medication List       Accurate as of 10/06/16  4:19 PM. Always use your most recent med list.          amLODipine 10 MG tablet Commonly known as:  NORVASC Take 10 mg by mouth daily.  aspirin 325 MG tablet Take 325 mg by mouth every 6 (six) hours as needed.   atorvastatin 40 MG tablet Commonly known as:  LIPITOR TK 1 T PO QD   dutasteride 0.5 MG capsule Commonly known as:  AVODART Take 1 capsule (0.5 mg total) by mouth daily.   KLOR-CON M10 10 MEQ tablet Generic drug:  potassium chloride Take 10 mEq by mouth every evening.   lisinopril-hydrochlorothiazide 20-12.5 MG tablet Commonly known as:  PRINZIDE,ZESTORETIC TK 1 T PO QD   tamsulosin 0.4 MG Caps capsule Commonly known as:  FLOMAX Take 1 capsule (0.4 mg total) by mouth daily after supper.            Discharge Care Instructions        Start     Ordered   10/06/16 0000  BLADDER SCAN AMB NON-IMAGING     10/06/16 1545      Allergies: No Known Allergies  Family History: Family History  Problem Relation Age of Onset  . Benign prostatic hyperplasia Unknown   . Coronary artery disease Unknown   . Prostate cancer Neg Hx   . Kidney cancer Neg Hx   . Bladder Cancer Neg Hx     Social History:  reports  that he has been smoking Pipe.  He has never used smokeless tobacco. He reports that he drinks about 4.2 oz of alcohol per week . He reports that he does not use drugs.  ROS: UROLOGY Frequent Urination?: No Hard to postpone urination?: No Burning/pain with urination?: No Get up at night to urinate?: No Leakage of urine?: No Urine stream starts and stops?: No Trouble starting stream?: No Do you have to strain to urinate?: No Blood in urine?: No Urinary tract infection?: No Sexually transmitted disease?: No Injury to kidneys or bladder?: No Painful intercourse?: No Weak stream?: No Erection problems?: No Penile pain?: No  Gastrointestinal Nausea?: No Vomiting?: No Indigestion/heartburn?: No Diarrhea?: No Constipation?: No  Constitutional Fever: No Night sweats?: No Weight loss?: No Fatigue?: No  Skin Skin rash/lesions?: No Itching?: No  Eyes Blurred vision?: No Double vision?: No  Ears/Nose/Throat Sore throat?: No Sinus problems?: No  Hematologic/Lymphatic Swollen glands?: No Easy bruising?: No  Cardiovascular Leg swelling?: No Chest pain?: No  Respiratory Cough?: No Shortness of breath?: No  Endocrine Excessive thirst?: No  Musculoskeletal Back pain?: No Joint pain?: No  Neurological Headaches?: No Dizziness?: No  Psychologic Depression?: No Anxiety?: No  Physical Exam: BP (!) 96/57   Pulse 84   Ht 5\' 8"  (1.727 m)   Wt 175 lb 12.8 oz (79.7 kg)   BMI 26.73 kg/m   Constitutional: Well nourished. Alert and oriented, No acute distress. HEENT: [place] AT, moist mucus membranes. Trachea midline, no masses. Cardiovascular: No clubbing, cyanosis, or edema. Respiratory: Normal respiratory effort, no increased work of breathing. GI: Abdomen is soft, non tender, non distended, no abdominal masses. Liver and spleen not palpable.  No hernias appreciated.  Stool sample for occult testing is not indicated.   GU: No CVA tenderness.  No bladder fullness  or masses.  Patient with circumcised phallus.  Urethral meatus is patent.  No penile discharge. No penile lesions or rashes. Scrotum without lesions, cysts, rashes and/or edema.  Testicles are located scrotally bilaterally. No masses are appreciated in the testicles. Left and right epididymis are normal. Rectal: Patient with  normal sphincter tone. Anus and perineum without scarring or rashes. No rectal masses are appreciated. Prostate is approximately 45 grams, no nodules are appreciated.  Seminal vesicles are normal. Skin: No rashes, bruises or suspicious lesions. Lymph: No cervical or inguinal adenopathy. Neurologic: Grossly intact, no focal deficits, moving all 4 extremities. Psychiatric: Normal mood and affect.   Laboratory Data PSA History  3.1 ng/mL on 05/25/2015  6.7 ng/mL on 10/04/2016  Pertinent Imaging  Results for BECKETT, MADEN (MRN [id number]) as of 10/06/2016 16:11  Ref. Range 10/06/2016 15:45  Scan Result Unknown 0    Assessment & Plan:   1)  History of  urinary retention - pvr is 0 mL  2) BPH - continue tamsulosin and dutasteride.  I PSS 2/1.  It is improving.    3) elevated PSA - normal DRE   Return in about 1 month (around 11/05/2016) for PSA only.  Michiel Cowboy, PA-C  [facility] [address] [place], [place] [address] [phone]

## 2016-10-06 ENCOUNTER — Encounter: Payer: Self-pay | Admitting: Urology

## 2016-10-06 ENCOUNTER — Ambulatory Visit (INDEPENDENT_AMBULATORY_CARE_PROVIDER_SITE_OTHER): Payer: Medicare Other | Admitting: Urology

## 2016-10-06 VITALS — BP 96/57 | HR 84 | Ht 68.0 in | Wt 175.8 lb

## 2016-10-06 DIAGNOSIS — N401 Enlarged prostate with lower urinary tract symptoms: Secondary | ICD-10-CM | POA: Diagnosis not present

## 2016-10-06 DIAGNOSIS — R972 Elevated prostate specific antigen [PSA]: Secondary | ICD-10-CM

## 2016-10-06 DIAGNOSIS — N138 Other obstructive and reflux uropathy: Secondary | ICD-10-CM

## 2016-10-06 DIAGNOSIS — Z87898 Personal history of other specified conditions: Secondary | ICD-10-CM | POA: Diagnosis not present

## 2016-10-06 LAB — BLADDER SCAN AMB NON-IMAGING: SCAN RESULT: 0

## 2016-11-02 ENCOUNTER — Other Ambulatory Visit: Payer: Self-pay

## 2016-11-02 ENCOUNTER — Other Ambulatory Visit: Payer: Medicare Other

## 2016-11-02 DIAGNOSIS — R972 Elevated prostate specific antigen [PSA]: Secondary | ICD-10-CM

## 2016-11-03 ENCOUNTER — Telehealth: Payer: Self-pay

## 2016-11-03 DIAGNOSIS — R972 Elevated prostate specific antigen [PSA]: Secondary | ICD-10-CM

## 2016-11-03 LAB — PSA: Prostate Specific Ag, Serum: 4 ng/mL (ref 0.0–4.0)

## 2016-11-03 NOTE — Telephone Encounter (Signed)
Spoke with pt in reference to PSA results and needing labs again in 10mo. Pt voiced understanding. Lab appt made and orders placed.

## 2016-11-03 NOTE — Telephone Encounter (Signed)
-----   Message from Harle Battiest, PA-C sent at 11/03/2016  8:01 AM EDT ----- Please let Mr. Sangha know that his PSA is starting to come down.  I would like to repeat it again in 3 months.

## 2017-01-03 ENCOUNTER — Other Ambulatory Visit: Payer: Medicare Other

## 2017-02-07 ENCOUNTER — Other Ambulatory Visit: Payer: Medicare Other

## 2017-02-07 DIAGNOSIS — R972 Elevated prostate specific antigen [PSA]: Secondary | ICD-10-CM

## 2017-02-08 LAB — PSA: PROSTATE SPECIFIC AG, SERUM: 4.2 ng/mL — AB (ref 0.0–4.0)

## 2017-02-10 ENCOUNTER — Telehealth: Payer: Self-pay

## 2017-02-10 NOTE — Telephone Encounter (Signed)
-----   Message from Harle BattiestShannon A McGowan, PA-C sent at 02/08/2017 10:01 AM EST ----- Please let Mr. Barry Hale know that his PSA is somewhat stable.  I would like to see him in March for an exam.

## 2017-02-10 NOTE — Telephone Encounter (Signed)
Patient notified , please schedule follow up 

## 2017-02-13 ENCOUNTER — Telehealth: Payer: Self-pay | Admitting: Urology

## 2017-02-13 NOTE — Telephone Encounter (Signed)
App scheduled with patient ° °Barry Hale °

## 2017-05-26 ENCOUNTER — Ambulatory Visit: Payer: Medicare Other | Admitting: Urology

## 2017-06-20 ENCOUNTER — Other Ambulatory Visit: Payer: Self-pay | Admitting: Urology

## 2017-06-29 NOTE — Progress Notes (Signed)
06/30/2017 11:18 AM   Barry Hale [birth year] [id number]  Referring provider: Fonnie Mu, MD No address on file  No chief complaint on file.   HPI: 72 yo WM with a history of urinary retention, BPH and LU TS and an elevated PSA who presents today for a follow up.  1) urinary retention - Patient presented 05/11/2016 with inability to void. Foley catheter was placed. He also has a remote history of urinary retention.     2) BPH - patient reports a long history of BPH and underwent TUMT with Dr. Leonette Monarch about 7 years ago. He was on Avodart but stopped that in 2016. He restarted dutasteride April 2018 and we added tamsulosin.  Current I PSS score 3/1.  Previous I PSS scoring is 2/1.    3) Elevated PSA - Patient underwent a biopsy of the prostate 2012 for a PSA of 7.6 ng/mL. The biopsy was negative. He had been on dutasteride until November 2016. His PSA was 3.2 ng/mL Apr 2016 and 4.01 Feb 2017.    IPSS    Row Name 06/30/17 1100         International Prostate Symptom Score   How often have you had the sensation of not emptying your bladder?  Less than 1 in 5     How often have you had to urinate less than every two hours?  Not at All     How often have you found you stopped and started again several times when you urinated?  Less than 1 in 5 times     How often have you found it difficult to postpone urination?  Not at All     How often have you had a weak urinary stream?  Not at All     How often have you had to strain to start urination?  Not at All     How many times did you typically get up at night to urinate?  1 Time     Total IPSS Score  3       Quality of Life due to urinary symptoms   If you were to spend the rest of your life with your urinary condition just the way it is now how would you feel about that?  Pleased        Score:  1-7 Mild 8-19 Moderate 20-35 Severe    PMH: Past Medical History:  Diagnosis Date  . 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 Asymptomatic carotid artery stenosis 06/05/2012  . 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 BP (high blood pressure) 10/21/2010  . BPH (benign prostatic hyperplasia)   . Elevated PSA   . HLD (hyperlipidemia)   . HTN (hypertension)   . Stroke (cerebrum) [facility])     Surgical History: Past Surgical History:  Procedure Laterality Date  . artery blockage    . PROSTATE SURGERY      Home Medications:  Allergies as of 06/30/2017   No Known Allergies     Medication List        Accurate as of 06/30/17 11:18 AM. Always use your most recent med list.          amLODipine 10 MG tablet Commonly known as:  NORVASC Take 10 mg by mouth daily.   atorvastatin 40 MG tablet Commonly known as:  LIPITOR TK 1 T PO QD  dutasteride 0.5 MG capsule Commonly known as:  AVODART TAKE 1 CAPSULE(0.5 MG) BY MOUTH DAILY   KLOR-CON M10 10 MEQ tablet Generic drug:  potassium chloride Take 10 mEq by mouth every evening.   lisinopril-hydrochlorothiazide 20-12.5 MG tablet Commonly known as:  PRINZIDE,ZESTORETIC TK 1 T PO QD   tamsulosin 0.4 MG Caps capsule Commonly known as:  FLOMAX Take 1 capsule (0.4 mg total) by mouth daily after supper.       Allergies: No Known Allergies  Family History: Family History  Problem Relation Age of Onset  . Benign prostatic hyperplasia Unknown   . Coronary artery disease Unknown   . Prostate cancer Neg Hx   . Kidney cancer Neg Hx   . Bladder Cancer Neg Hx     Social History:  reports that he has been smoking pipe.  He has never used smokeless tobacco. He reports that he drinks about 4.2 oz of alcohol per week. He reports that he does not use drugs.  ROS: UROLOGY Frequent Urination?: No Hard to postpone urination?: No Burning/pain with urination?: No Get up at night to urinate?: No Leakage of  urine?: No Urine stream starts and stops?: No Trouble starting stream?: No Do you have to strain to urinate?: No Blood in urine?: No Urinary tract infection?: No Sexually transmitted disease?: No Injury to kidneys or bladder?: No Painful intercourse?: No Weak stream?: No Erection problems?: No Penile pain?: No  Gastrointestinal Nausea?: No Vomiting?: No Indigestion/heartburn?: No Diarrhea?: No Constipation?: No  Constitutional Fever: No Night sweats?: No Weight loss?: No Fatigue?: No  Skin Skin rash/lesions?: No Itching?: No  Eyes Blurred vision?: No Double vision?: No  Ears/Nose/Throat Sore throat?: No Sinus problems?: No  Hematologic/Lymphatic Swollen glands?: No Easy bruising?: No  Cardiovascular Leg swelling?: No Chest pain?: No  Respiratory Cough?: No Shortness of breath?: No  Endocrine Excessive thirst?: No  Musculoskeletal Back pain?: No Joint pain?: No  Neurological Headaches?: No Dizziness?: No  Psychologic Depression?: No Anxiety?: No  Physical Exam: BP (!) 96/57   Pulse 82   Ht  (1.753 m)   Wt 178 lb (80.7 kg)   BMI 26.29 kg/m   Constitutional: Well nourished. Alert and oriented, No acute distress. HEENT: [place] AT, moist mucus membranes. Trachea midline, no masses. Cardiovascular: No clubbing, cyanosis, or edema. Respiratory: Normal respiratory effort, no increased work of breathing. GI: obese, abdomen is soft, non tender, non distended, no abdominal masses. Liver and spleen not palpable.  No hernias appreciated.  Stool sample for occult testing is not indicated.   GU: No CVA tenderness.  No bladder fullness or masses.  Patient with circumcised phallus.   Urethral meatus is patent.  No penile discharge. No penile lesions or rashes. Scrotum without lesions, cysts, rashes and/or edema.  Testicles are located scrotally bilaterally. No masses are appreciated in the testicles. Left and right epididymis are normal. Rectal: Patient  with  normal sphincter tone. Anus and perineum without scarring or rashes. No rectal masses are appreciated. Prostate is approximately 45 grams, no nodules are appreciated. Seminal vesicles are normal. Skin: No rashes, bruises or suspicious lesions. Lymph: No cervical or inguinal adenopathy. Neurologic: Grossly intact, no focal deficits, moving all 4 extremities. Psychiatric: Normal mood and affect.  Laboratory Data PSA History  3.1 ng/mL on 05/25/2015  6.7 ng/mL on 10/04/2016  4.0 ng/mL on 11/02/2016  4.2 ng/mL on 02/07/2017   I have reviewed the labs.    Assessment & Plan:   1)  History of  urinary retention - patient voiding freely  2) BPH - continue tamsulosin and dutasteride.  I PSS 3/1.  It is slightly worse  - follow up in 6 months for I PSS, PSA and exam - if PSA is stable  3) elevated PSA - normal DRE - PSA drawn today - follow up in 6 months if PSA is stable  Return in about 6 months (around 12/30/2017) for IPSS, PSA and exam.  Michiel Cowboy, [facility]  [facility] [address] [place], [place] [address] [phone]

## 2017-06-30 ENCOUNTER — Encounter: Payer: Self-pay | Admitting: Urology

## 2017-06-30 ENCOUNTER — Other Ambulatory Visit
Admission: RE | Admit: 2017-06-30 | Discharge: 2017-06-30 | Disposition: A | Discharge status: Home / Self Care | Payer: Medicare Other | Source: Ambulatory Visit | Attending: Urology | Admitting: Urology

## 2017-06-30 ENCOUNTER — Ambulatory Visit (INDEPENDENT_AMBULATORY_CARE_PROVIDER_SITE_OTHER): Payer: Medicare Other | Admitting: Urology

## 2017-06-30 VITALS — BP 96/57 | HR 82 | Ht 69.0 in | Wt 178.0 lb

## 2017-06-30 DIAGNOSIS — N138 Other obstructive and reflux uropathy: Secondary | ICD-10-CM | POA: Diagnosis not present

## 2017-06-30 DIAGNOSIS — Z87898 Personal history of other specified conditions: Secondary | ICD-10-CM | POA: Diagnosis not present

## 2017-06-30 DIAGNOSIS — R972 Elevated prostate specific antigen [PSA]: Secondary | ICD-10-CM

## 2017-06-30 DIAGNOSIS — N401 Enlarged prostate with lower urinary tract symptoms: Secondary | ICD-10-CM | POA: Diagnosis not present

## 2017-06-30 LAB — PSA: PROSTATIC SPECIFIC ANTIGEN: 4.22 ng/mL — AB (ref 0.00–4.00)

## 2017-06-30 MED ORDER — TAMSULOSIN HCL 0.4 MG PO CAPS: 0.4000 mg | ORAL_CAPSULE | Freq: Every day | ORAL | 3 refills | Status: DC

## 2017-06-30 MED ORDER — DUTASTERIDE 0.5 MG PO CAPS: ORAL_CAPSULE | ORAL | 3 refills | Status: DC

## 2017-06-30 NOTE — Addendum Note (Signed)
Addended by: Martha ClanWATTS, Matther Labell M on: 06/30/2017 01:26 PM   Modules accepted: Orders

## 2017-07-03 ENCOUNTER — Telehealth: Payer: Self-pay | Admitting: Urology

## 2017-07-03 ENCOUNTER — Telehealth: Payer: Self-pay

## 2017-07-03 DIAGNOSIS — R972 Elevated prostate specific antigen [PSA]: Secondary | ICD-10-CM

## 2017-07-03 NOTE — Telephone Encounter (Signed)
Orders placed, please call and schedule 3 month lab

## 2017-07-03 NOTE — Telephone Encounter (Signed)
-----   Message from Harle BattiestShannon A McGowan, PA-C sent at 07/03/2017 12:34 PM EDT ----- I spoke to the Mr. Barry Hale regarding his PSA and he would like it rechecked in three months.  Please schedule the lab appointment.

## 2017-07-03 NOTE — Telephone Encounter (Signed)
Patient has labs drawn in [place] and he does not need an appt for this. Called and let him know that he just needs go to the lab in 3 months to have them drawn.  Marcelino DusterMichelle

## 2018-01-02 ENCOUNTER — Other Ambulatory Visit
Admission: RE | Admit: 2018-01-02 | Discharge: 2018-01-02 | Disposition: A | Discharge status: Home / Self Care | Payer: Medicare Other | Source: Ambulatory Visit | Attending: Urology | Admitting: Urology

## 2018-01-02 DIAGNOSIS — R972 Elevated prostate specific antigen [PSA]: Secondary | ICD-10-CM | POA: Insufficient documentation

## 2018-01-02 LAB — PSA: Prostatic Specific Antigen: 5.11 ng/mL — ABNORMAL HIGH (ref 0.00–4.00)

## 2018-01-05 ENCOUNTER — Ambulatory Visit: Payer: Medicare Other | Admitting: Urology

## 2018-01-08 ENCOUNTER — Ambulatory Visit (INDEPENDENT_AMBULATORY_CARE_PROVIDER_SITE_OTHER): Payer: Medicare Other | Admitting: Urology

## 2018-01-08 ENCOUNTER — Encounter: Payer: Self-pay | Admitting: Urology

## 2018-01-08 ENCOUNTER — Other Ambulatory Visit: Payer: Self-pay

## 2018-01-08 VITALS — BP 148/79 | HR 78 | Wt 182.0 lb

## 2018-01-08 DIAGNOSIS — Z87898 Personal history of other specified conditions: Secondary | ICD-10-CM

## 2018-01-08 DIAGNOSIS — N401 Enlarged prostate with lower urinary tract symptoms: Secondary | ICD-10-CM

## 2018-01-08 DIAGNOSIS — R972 Elevated prostate specific antigen [PSA]: Secondary | ICD-10-CM

## 2018-01-08 DIAGNOSIS — N138 Other obstructive and reflux uropathy: Secondary | ICD-10-CM | POA: Diagnosis not present

## 2018-01-08 NOTE — Progress Notes (Signed)
01/08/2018 3:25 PM   Barry Hale 05-10-1945 [id number]  Referring provider: Fonnie MuHalpern, David, MD No address on file  Chief Complaint  Patient presents with  . Urinary Retention    HPI: 72 yo WM with a history of urinary retention, BPH and LU TS and an elevated PSA who presents today for a follow up.  1) Urinary retention x 2 - voiding freely   2) BPH - patient reports a long history of BPH and underwent TUMT with Dr. Leonette MonarchHarman about 7 years ago. He was on Avodart but stopped that in 2016. He restarted dutasteride April 2018 and we added tamsulosin.  Current I PSS score 5/0.  Previous I PSS scoring is 3/1.    3) Elevated PSA  PSA Trend  7.6 in 2012 - bx negative  3.1 in 05/2015  6.7 in 10/2016  4.0 in 10/2016  4.2 in 01/2017  4.22 in 08/2017  5.11 in 12/2017    IPSS    Row Name 01/08/18 1500         International Prostate Symptom Score   How often have you had the sensation of not emptying your bladder?  Less than 1 in 5     How often have you had to urinate less than every two hours?  Less than 1 in 5 times     How often have you found you stopped and started again several times when you urinated?  Less than 1 in 5 times     How often have you found it difficult to postpone urination?  Not at All     How often have you had a weak urinary stream?  Less than 1 in 5 times     How often have you had to strain to start urination?  Not at All     How many times did you typically get up at night to urinate?  1 Time     Total IPSS Score  5       Quality of Life due to urinary symptoms   If you were to spend the rest of your life with your urinary condition just the way it is now how would you feel about that?  Delighted        Score:  1-7 Mild 8-19 Moderate 20-35 Severe    PMH: Past Medical History:  Diagnosis Date  . 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 TIA in 01/2009 with near-total occlusion of left  internal carotid; underwent carotid endarterectomy   . Asymptomatic carotid artery stenosis 06/05/2012  . 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 BP (high blood pressure) 10/21/2010  . BPH (benign prostatic hyperplasia)   . Elevated PSA   . HLD (hyperlipidemia)   . HTN (hypertension)   . Stroke (cerebrum) [facility]     Surgical History: Past Surgical History:  Procedure Laterality Date  . artery blockage    . PROSTATE SURGERY      Home Medications:  Allergies as of 01/08/2018   No Known Allergies     Medication List        Accurate as of 01/08/18  3:25 PM. Always use your most recent med list.          amLODipine 10 MG tablet Commonly known as:  NORVASC Take 10 mg by mouth daily.   atorvastatin 40 MG tablet Commonly known as:  LIPITOR TK 1 T PO QD   dutasteride 0.5 MG capsule Commonly known  as:  AVODART TAKE 1 CAPSULE(0.5 MG) BY MOUTH DAILY   KLOR-CON M10 10 MEQ tablet Generic drug:  potassium chloride Take 10 mEq by mouth every evening.   lisinopril-hydrochlorothiazide 20-12.5 MG tablet Commonly known as:  PRINZIDE,ZESTORETIC TK 1 T PO QD   tamsulosin 0.4 MG Caps capsule Commonly known as:  FLOMAX Take 1 capsule (0.4 mg total) by mouth daily after supper.       Allergies: No Known Allergies  Family History: Family History  Problem Relation Age of Onset  . Benign prostatic hyperplasia Unknown   . Coronary artery disease Unknown   . Prostate cancer Neg Hx   . Kidney cancer Neg Hx   . Bladder Cancer Neg Hx     Social History:  reports that he has been smoking pipe. He has never used smokeless tobacco. He reports that he drinks about 7.0 standard drinks of alcohol per week. He reports that he does not use drugs.  ROS: UROLOGY Frequent Urination?: No Hard to postpone urination?: No Burning/pain with urination?: No Get up at night to urinate?: Yes Leakage of urine?: No Urine stream starts and stops?: No Trouble starting stream?: No Do you have to  strain to urinate?: No Blood in urine?: No Urinary tract infection?: No Sexually transmitted disease?: No Injury to kidneys or bladder?: No Painful intercourse?: No Weak stream?: No Erection problems?: No Penile pain?: No  Gastrointestinal Nausea?: No Vomiting?: No Indigestion/heartburn?: No Diarrhea?: No Constipation?: No  Constitutional Fever: No Night sweats?: No Weight loss?: No Fatigue?: No  Skin Skin rash/lesions?: No Itching?: No  Eyes Blurred vision?: No Double vision?: No  Ears/Nose/Throat Sore throat?: No Sinus problems?: No  Hematologic/Lymphatic Swollen glands?: No Easy bruising?: No  Cardiovascular Leg swelling?: No Chest pain?: No  Respiratory Cough?: No Shortness of breath?: No  Endocrine Excessive thirst?: No  Musculoskeletal Back pain?: No Joint pain?: No  Neurological Headaches?: No Dizziness?: No  Psychologic Depression?: No Anxiety?: No  Physical Exam: BP (!) 148/79   Pulse 78   Wt 182 lb (82.6 kg)   BMI 26.88 kg/m   Constitutional: Well nourished. Alert and oriented, No acute distress. HEENT: [place] AT, moist mucus membranes. Trachea midline, no masses. Cardiovascular: No clubbing, cyanosis, or edema. Respiratory: Normal respiratory effort, no increased work of breathing. GI: Abdomen is soft, non tender, non distended, no abdominal masses. Liver and spleen not palpable.  No hernias appreciated.  Stool sample for occult testing is not indicated.   GU: No CVA tenderness.  No bladder fullness or masses.  Patient with circumcised phallus.  Urethral meatus is patent.  No penile discharge. No penile lesions or rashes. Scrotum without lesions, cysts, rashes and/or edema.  Testicles are located scrotally bilaterally. No masses are appreciated in the testicles. Left and right epididymis are normal. Rectal: Patient with  normal sphincter tone. Anus and perineum without scarring or rashes. No rectal masses are appreciated. Prostate is  approximately 60 + grams, could only palpate apex and midportion of gland, no nodules are appreciated. Skin: No rashes, bruises or suspicious lesions. Lymph: No cervical or inguinal adenopathy. Neurologic: Grossly intact, no focal deficits, moving all 4 extremities. Psychiatric: Normal mood and affect.  Laboratory Data PSA History  See HPI  I have reviewed the labs.    Assessment & Plan:   1. Elevated PSA Discussed with the patient that the increasing PSA while on dutasteride is concerning for prostate cancer and that at this time he may choose to undergo a repeat biopsy, MRI  of the prostate or approach the elevation in a palliative manner and only intervene if he should start experiencing symptoms - he has chosen the latter  I did advise him that if he has prostate cancer and it is left unchecked, it may spread to other parts of his body (bones and organs) and cause dysfunction in those structures (such as fractures and organ failures) - he voiced that he understood this and would like to continue with his 6 months visits with PSA and exams - he will contact us if he has worsening of symptoms or decides to forward with a MRI or biopsy   2)  History of  urinary retention - patient voiding freely  3. BPH with LUTS IPSS score is 5/0 Continue conservative management, avoiding bladder irritants and timed voiding's Continue tamsulosin 0.4 mg daily and dutasteride 0.5 mg daily RTC in 6 months for IPSS, PSA and exam    Return in about 6 months (around 07/10/2018) for IPSS, PSA and exam.  Michiel Cowboy, [facility]  [facility] [address] [place], [place] [address] [phone]

## 2018-05-13 DIAGNOSIS — I442 Atrioventricular block, complete: Secondary | ICD-10-CM | POA: Insufficient documentation

## 2018-05-15 DIAGNOSIS — Z950 Presence of cardiac pacemaker: Secondary | ICD-10-CM | POA: Insufficient documentation

## 2018-05-15 DIAGNOSIS — R55 Syncope and collapse: Secondary | ICD-10-CM | POA: Insufficient documentation

## 2018-07-16 ENCOUNTER — Ambulatory Visit: Payer: Self-pay | Admitting: Urology

## 2018-07-22 ENCOUNTER — Other Ambulatory Visit: Payer: Self-pay | Admitting: Urology

## 2018-08-06 ENCOUNTER — Other Ambulatory Visit: Payer: Self-pay | Admitting: Urology

## 2018-09-28 ENCOUNTER — Telehealth: Payer: Self-pay | Admitting: Urology

## 2018-09-28 NOTE — Telephone Encounter (Signed)
Appt made pt notified.

## 2018-09-28 NOTE — Telephone Encounter (Signed)
Please call Mr. Welz and reschedule his missed appointment with a PSA prior.

## 2018-12-17 ENCOUNTER — Ambulatory Visit: Payer: Medicare Other | Admitting: Urology

## 2019-01-14 ENCOUNTER — Encounter: Payer: Self-pay | Admitting: Urology

## 2019-08-15 ENCOUNTER — Other Ambulatory Visit: Payer: Self-pay | Admitting: Urology

## 2019-11-17 ENCOUNTER — Other Ambulatory Visit: Payer: Self-pay

## 2019-11-17 ENCOUNTER — Emergency Department
Admission: EM | Admit: 2019-11-17 | Discharge: 2019-11-17 | Disposition: A | Discharge status: Home / Self Care | Payer: Medicare Other | Attending: Emergency Medicine | Admitting: Emergency Medicine

## 2019-11-17 DIAGNOSIS — R109 Unspecified abdominal pain: Secondary | ICD-10-CM | POA: Diagnosis present

## 2019-11-17 DIAGNOSIS — N3090 Cystitis, unspecified without hematuria: Secondary | ICD-10-CM

## 2019-11-17 DIAGNOSIS — F17290 Nicotine dependence, other tobacco product, uncomplicated: Secondary | ICD-10-CM | POA: Insufficient documentation

## 2019-11-17 DIAGNOSIS — R339 Retention of urine, unspecified: Secondary | ICD-10-CM | POA: Insufficient documentation

## 2019-11-17 LAB — URINALYSIS, COMPLETE (UACMP) WITH MICROSCOPIC
Bilirubin Urine: NEGATIVE
Glucose, UA: NEGATIVE mg/dL
Ketones, ur: NEGATIVE mg/dL
Leukocytes,Ua: NEGATIVE
Nitrite: POSITIVE — AB
Protein, ur: NEGATIVE mg/dL
Specific Gravity, Urine: 1.013 (ref 1.005–1.030)
Squamous Epithelial / LPF: NONE SEEN (ref 0–5)
pH: 6 (ref 5.0–8.0)

## 2019-11-17 LAB — CBC WITH DIFFERENTIAL/PLATELET
Abs Immature Granulocytes: 0.05 10*3/uL (ref 0.00–0.07)
Basophils Absolute: 0 10*3/uL (ref 0.0–0.1)
Basophils Relative: 0 %
Eosinophils Absolute: 0 10*3/uL (ref 0.0–0.5)
Eosinophils Relative: 0 %
HCT: 44.3 % (ref 39.0–52.0)
Hemoglobin: 14.8 g/dL (ref 13.0–17.0)
Immature Granulocytes: 0 %
Lymphocytes Relative: 9 %
Lymphs Abs: 1 10*3/uL (ref 0.7–4.0)
MCH: 26.9 pg (ref 26.0–34.0)
MCHC: 33.4 g/dL (ref 30.0–36.0)
MCV: 80.4 fL (ref 80.0–100.0)
Monocytes Absolute: 0.7 10*3/uL (ref 0.1–1.0)
Monocytes Relative: 6 %
Neutro Abs: 9.7 10*3/uL — ABNORMAL HIGH (ref 1.7–7.7)
Neutrophils Relative %: 85 %
Platelets: 226 10*3/uL (ref 150–400)
RBC: 5.51 MIL/uL (ref 4.22–5.81)
RDW: 14.1 % (ref 11.5–15.5)
WBC: 11.5 10*3/uL — ABNORMAL HIGH (ref 4.0–10.5)
nRBC: 0 % (ref 0.0–0.2)

## 2019-11-17 LAB — BASIC METABOLIC PANEL
Anion gap: 12 (ref 5–15)
BUN: 13 mg/dL (ref 8–23)
CO2: 20 mmol/L — ABNORMAL LOW (ref 22–32)
Calcium: 9.1 mg/dL (ref 8.9–10.3)
Chloride: 100 mmol/L (ref 98–111)
Creatinine, Ser: 0.99 mg/dL (ref 0.61–1.24)
GFR, Estimated: >60 mL/min (ref >60)
Glucose, Bld: 133 mg/dL — ABNORMAL HIGH (ref 70–99)
Potassium: 3.2 mmol/L — ABNORMAL LOW (ref 3.5–5.1)
Sodium: 132 mmol/L — ABNORMAL LOW (ref 135–145)

## 2019-11-17 MED ORDER — CEPHALEXIN 500 MG PO CAPS: 500.0000 mg | ORAL_CAPSULE | Freq: Two times a day (BID) | ORAL | 0 refills | Status: AC

## 2019-11-17 MED ORDER — CEPHALEXIN 500 MG PO CAPS
500.0000 mg | ORAL_CAPSULE | Freq: Once | ORAL | Status: AC
  Administered 2019-11-17: 500 mg via ORAL
  Filled 2019-11-17: qty 1

## 2019-11-17 MED ORDER — POTASSIUM CHLORIDE CRYS ER 20 MEQ PO TBCR
40.0000 meq | EXTENDED_RELEASE_TABLET | Freq: Once | ORAL | Status: AC
  Administered 2019-11-17: 40 meq via ORAL
  Filled 2019-11-17: qty 2

## 2019-11-17 NOTE — ED Provider Notes (Signed)
[facility] Emergency Department Provider Note   ____________________________________________   First MD Initiated Contact with Patient 11/17/19 0902     (approximate)  I have reviewed the triage vital signs and the nursing notes.   HISTORY  Chief Complaint Urinary Retention    HPI Barry Hale is a 74 y.o. male with possible history of hypertension, stroke, hyperlipidemia, and BPH who presents to the ED complaining of urinary retention.  Patient reports that he has been unable to urinate since around lunchtime yesterday.  He has noticed progressive swelling to his lower abdomen along with increasing discomfort.  He states he was dealing with some dysuria prior to onset of urinary retention, but he denies any recent hematuria.  He has not had any fevers, chills, cough, chest pain, shortness of breath, or flank pain.  He has dealt with this issue previously and required an indwelling Foley a couple of years ago.  He has also self caths occasionally in the past but used his last catheter 5 days ago.        Past Medical History:  Diagnosis Date  . 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 Asymptomatic carotid artery stenosis 06/05/2012  . 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 BP (high blood pressure) 10/21/2010  . BPH (benign prostatic hyperplasia)   . Elevated PSA   . HLD (hyperlipidemia)   . HTN (hypertension)   . Stroke (cerebrum) [facility])     Patient Active Problem List   Diagnosis Date Noted  . BPH with obstruction/lower urinary tract symptoms 05/26/2015  . History of elevated PSA 05/26/2015  . Asymptomatic carotid artery stenosis 06/05/2012  . 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 Artery disease, cerebral 10/21/2010  . BP (high blood pressure) 10/21/2010  . HLD (hyperlipidemia)  10/21/2010    Past Surgical History:  Procedure Laterality Date  . artery blockage    . PROSTATE SURGERY      Prior to Admission medications   Medication Sig Start Date End Date Taking? Authorizing Provider  amLODipine (NORVASC) 10 MG tablet Take 10 mg by mouth daily.  03/24/15   [provider]  atorvastatin (LIPITOR) 40 MG tablet TK 1 T PO QD 04/20/15   [provider]  cephALEXin (KEFLEX) 500 MG capsule Take 1 capsule (500 mg total) by mouth 2 (two) times daily for 7 days. 11/17/19 11/24/19  Chesley Noon, MD  dutasteride (AVODART) 0.5 MG capsule TAKE 1 CAPSULE(0.5 MG) BY MOUTH DAILY 06/30/17   McGowan, Carollee Herter A, PA-C  KLOR-CON M10 10 MEQ tablet Take 10 mEq by mouth every evening.  05/19/15   [provider]  lisinopril-hydrochlorothiazide (PRINZIDE,ZESTORETIC) 20-12.5 MG tablet TK 1 T PO QD 04/20/15   [provider]  tamsulosin (FLOMAX) 0.4 MG CAPS capsule TAKE ONE CAPSULE BY MOUTH DAILY AFTER SUPPER 08/06/18   McGowan, Carollee Herter A, PA-C    Allergies Patient has no known allergies.  Family History  Problem Relation Age of Onset  . Benign prostatic hyperplasia Other   . Coronary artery disease Other   . Prostate cancer Neg Hx   . Kidney cancer Neg Hx   . Bladder Cancer Neg Hx     Social History Social History   Tobacco Use  . Smoking status: Current Every Day Smoker    Types: Pipe  . Smokeless tobacco: Never Used  Vaping Use  .  Vaping Use: Never used  Substance Use Topics  . Alcohol use: Yes    Alcohol/week: 7.0 standard drinks    Types: 7 Cans of beer per week    Comment: one a day per patient  . Drug use: No    Review of Systems  Constitutional: No fever/chills Eyes: No visual changes. ENT: No sore throat. Cardiovascular: Denies chest pain. Respiratory: Denies shortness of breath. Gastrointestinal: Positive for abdominal pain.  No nausea, no vomiting.  No diarrhea.  No constipation. Genitourinary: Positive for dysuria and  urinary retention. Musculoskeletal: Negative for back pain. Skin: Negative for rash. Neurological: Negative for headaches, focal weakness or numbness.  ____________________________________________   PHYSICAL EXAM:  VITAL SIGNS: ED Triage Vitals  Enc Vitals Group     BP 11/17/19 0851 (!) 170/70     Pulse Rate 11/17/19 0855 (!) 42     Resp 11/17/19 0851 20     Temp 11/17/19 0855 (!) 97.3 F (36.3 C)     Temp Source 11/17/19 0855 Oral     SpO2 11/17/19 0851 98 %     Weight 11/17/19 0853 185 lb (83.9 kg)     Height 11/17/19 0853 5\' 10"  (1.778 m)     Head Circumference --      Peak Flow --      Pain Score 11/17/19 0853 7     Pain Loc --      Pain Edu? --      Excl. in GC? --     Constitutional: Alert and oriented. Eyes: Conjunctivae are normal. Head: Atraumatic. Nose: No congestion/rhinnorhea. Mouth/Throat: Mucous membranes are moist. Neck: Normal ROM Cardiovascular: Normal rate, regular rhythm. Grossly normal heart sounds. Respiratory: Normal respiratory effort.  No retractions. Lungs CTAB. Gastrointestinal: Soft and nontender. No distention. Genitourinary: Probable distended bladder with associated tenderness. Musculoskeletal: No lower extremity tenderness nor edema. Neurologic:  Normal speech and language. No gross focal neurologic deficits are appreciated. Skin:  Skin is warm, dry and intact. No rash noted. Psychiatric: Mood and affect are normal. Speech and behavior are normal.  ____________________________________________   LABS (all labs ordered are listed, but only abnormal results are displayed)  Labs Reviewed  CBC WITH DIFFERENTIAL/PLATELET - Abnormal; Notable for the following components:      Result Value   WBC 11.5 (*)    Neutro Abs 9.7 (*)    All other components within normal limits  BASIC METABOLIC PANEL - Abnormal; Notable for the following components:   Sodium 132 (*)    Potassium 3.2 (*)    CO2 20 (*)    Glucose, Bld 133 (*)    All other  components within normal limits  URINALYSIS, COMPLETE (UACMP) WITH MICROSCOPIC - Abnormal; Notable for the following components:   Color, Urine YELLOW (*)    APPearance HAZY (*)    Hgb urine dipstick MODERATE (*)    Nitrite POSITIVE (*)    Bacteria, UA RARE (*)    All other components within normal limits  URINE CULTURE     PROCEDURES  Procedure(s) performed (including Critical Care):  Procedures   ____________________________________________   INITIAL IMPRESSION / ASSESSMENT AND PLAN / ED COURSE       74 year old male with past medical history of hypertension, hyperlipidemia, stroke, BPH presents to the ED with inability to urinate since yesterday afternoon, which was preceded by some dysuria.  On exam he appears to have a swollen and distended bladder, we will further assess with bladder scan but anticipate placement of Foley  for acute urinary retention.  He reports some dysuria and we will screen UA as well as renal function.  Bladder scan shows almost 1 L of retained urine, Foley catheter placed with approximately 1.2 L of cloudy yellow urine into collecting bag.  UA is concerning for UTI with positive nitrites, patient given initial dose of Keflex and urine sent for culture.  Blood work is reassuring outside of mild hypokalemia, bradycardia has resolved with placement of Foley catheter.  Patient is appropriate for discharge home with urology follow-up, was counseled to return to the ED for new or worsening symptoms.  Patient agrees with plan.      ____________________________________________   FINAL CLINICAL IMPRESSION(S) / ED DIAGNOSES  Final diagnoses:  Urinary retention  Cystitis     ED Discharge Orders         Ordered    cephALEXin (KEFLEX) 500 MG capsule  2 times daily        11/17/19 1108           Note:  This document was prepared using Dragon voice recognition software and may include unintentional dictation errors.   Chesley Noon, MD 11/17/19  1110

## 2019-11-17 NOTE — ED Triage Notes (Signed)
Pt to ED POV states he is having "swollen prostate and swollen bladder" Urinary retention since Saturday evening.  States has urge to urinate but cannot.  Hx of urinary retention, recently had catheter removed Bradycardia in triage, has pacemaker

## 2019-11-17 NOTE — ED Notes (Signed)
Bladder scanned 958 cc

## 2019-11-17 NOTE — ED Notes (Signed)
D/C instructions given.  RX discussed.  Pt refused leg bag, states he wishes to leave with bigger bag, as it holds more.   Advised of follow up with urology. Pt sent home with leg bag.  States he understands how to change out if he chooses to a later time.  Encouraged good hand hygiene.  All questions addressed.  Understanding verbalized.  Pt leaving with mother.

## 2019-11-19 LAB — URINE CULTURE: Culture: >=100000 — AB

## 2019-11-20 NOTE — Progress Notes (Addendum)
ED Antimicrobial Stewardship Positive Culture Follow Up   Barry Hale is an 74 y.o. male who presented to [facility] on 11/17/2019 with a chief complaint of urinary retention. Patient reports that he has been unable to urinate since around lunchtime yesterday (10/16). He states he was dealing with some dysuria prior to onset of urinary retention, but he denies any recent hematuria. He has dealt with this issue previously and required an indwelling Foley a couple of years ago.  He has also self cath occasionally in the past but used his last catheter 5 days ago (10/12). Called patient on 10/20 and he states he does not feel any symptoms at this time but feels it is because he currently has a catheter in. He stated that his urine is dark and that he would feel better if he had an option for antibiotics if needed. He has an appointment with a urologist on 10/27.   Chief Complaint  Patient presents with  . Urinary Retention    Recent Results (from the past 720 hour(s))  Urine culture     Status: Abnormal   Collection Time: 11/17/19  9:42 AM   Specimen: Urine, Random  Result Value Ref Range Status   Specimen Description   Final    URINE, RANDOM Performed at [facility], [address] [place], [place] [address]    Special Requests   Final    NONE Performed at [facility], [address] [place], [place] [address]    Culture >=100,000 COLONIES/mL STAPHYLOCOCCUS CAPITIS (A)  Final   Report Status 11/19/2019 FINAL  Final   Organism ID, Bacteria STAPHYLOCOCCUS CAPITIS (A)  Final      Susceptibility   Staphylococcus capitis - MIC*    CIPROFLOXACIN <=0.5 SENSITIVE Sensitive     GENTAMICIN <=0.5 SENSITIVE Sensitive     NITROFURANTOIN <=16 SENSITIVE Sensitive     OXACILLIN 2 RESISTANT Resistant     TETRACYCLINE <=1 SENSITIVE Sensitive     VANCOMYCIN <=0.5 SENSITIVE Sensitive     TRIMETH/SULFA <=10 SENSITIVE Sensitive     CLINDAMYCIN <=0.25 SENSITIVE Sensitive      RIFAMPIN <=0.5 SENSITIVE Sensitive     Inducible Clindamycin NEGATIVE Sensitive     * >=100,000 COLONIES/mL STAPHYLOCOCCUS CAPITIS    [x]  Treated with cephalexin, organism resistant to prescribed antimicrobial  New antibiotic prescription: Bactrim DS BID x 7days  ED Provider: Dr.   Antoine Primas, PharmD Pharmacy Resident  11/20/2019 3:24 PM

## 2019-11-26 NOTE — Progress Notes (Signed)
11/27/2019 3:07 PM   Master Kauer 1946/01/27 [id number]  Referring provider: Fonnie Mu, MD No address on file  Chief Complaint  Patient presents with  . Urinary Retention    HPI: 74 yo WM with urinary retention, BPH and LU TS and an elevated PSA who presents today for a follow up.  Urinary retention Patient was seen in the ED in November 17, 2019 for inability to urinate.  Foley catheter was placed by ER staff and 1 L of urine was obtained.  Serum creatinine was 0.9 which is baseline.  A was suspicious for infection.  Urine culture grew out Staphylococcus capitis.  He was discharged on Keflex.    Foley catheter was removed this morning without issue.  His PVR this afternoon was 97 cc.  He does not feel the urge to urinate.  BPH with retention  patient reports a long history of BPH and underwent TUMT with Dr. Leonette Monarch about 7 years ago. He was on Avodart but stopped that in 2016.  He restarted the tamsulosin and Avodart, but then discontinued them again when the prescription expired and he did not return for an appointment due to Covid.     3) Elevated PSA  PSA Trend  7.6 in 2012 - bx negative  3.1 in 05/2015  6.7 in 10/2016  4.0 in 10/2016  4.2 in 01/2017  4.22 in 08/2017  5.11 in 12/2017    PMH: Past Medical History:  Diagnosis Date  . 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 Asymptomatic carotid artery stenosis 06/05/2012  . 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 BP (high blood pressure) 10/21/2010  . BPH (benign prostatic hyperplasia)   . Elevated PSA   . HLD (hyperlipidemia)   . HTN (hypertension)   . Stroke (cerebrum) [facility])     Surgical History: Past Surgical History:  Procedure Laterality Date  . artery blockage    . PROSTATE SURGERY      Home Medications:  Allergies as of 11/27/2019   No  Known Allergies     Medication List       Accurate as of November 27, 2019  3:07 PM. If you have any questions, ask your nurse or doctor.        amLODipine 10 MG tablet Commonly known as: NORVASC Take 10 mg by mouth daily.   atorvastatin 40 MG tablet Commonly known as: LIPITOR TK 1 T PO QD   dutasteride 0.5 MG capsule Commonly known as: AVODART TAKE 1 CAPSULE(0.5 MG) BY MOUTH DAILY   Klor-Con M10 10 MEQ tablet Generic drug: potassium chloride Take 10 mEq by mouth every evening.   lisinopril-hydrochlorothiazide 20-12.5 MG tablet Commonly known as: ZESTORETIC TK 1 T PO QD   sulfamethoxazole-trimethoprim 800-160 MG tablet Commonly known as: BACTRIM DS Take 1 tablet by mouth 2 (two) times daily.   tamsulosin 0.4 MG Caps capsule Commonly known as: FLOMAX TAKE ONE CAPSULE BY MOUTH DAILY AFTER SUPPER       Allergies: No Known Allergies  Family History: Family History  Problem Relation Age of Onset  . Benign prostatic hyperplasia Other   . Coronary artery disease Other   . Prostate cancer Neg Hx   . Kidney cancer Neg Hx   . Bladder Cancer Neg Hx     Social History:  reports that he has been smoking pipe. He has never  used smokeless tobacco. He reports current alcohol use of about 7.0 standard drinks of alcohol per week. He reports that he does not use drugs.  ROS: For pertinent review of systems please refer to history of present illness  Physical Exam: BP 109/71   Pulse 81   Constitutional:  Well nourished. Alert and oriented, No acute distress. HEENT: [place] AT, mask in place.  Trachea midline Cardiovascular: No clubbing, cyanosis, or edema. Respiratory: Normal respiratory effort, no increased work of breathing. Neurologic: Grossly intact, no focal deficits, moving all 4 extremities. Psychiatric: Normal mood and affect.  Laboratory Data PSA History  See HPI  I have reviewed the labs.  Catheter Removal Patient is present today for a catheter removal.  9  ml of water was drained from the balloon. A 16 FR foley cath was removed from the bladder no complications were noted . Patient tolerated well.  Performed by: Michiel Cowboy, PA-C   Assessment & Plan:   1. Urinary retention - foley catheter removed -voiding trial today -encourage patient to increase his fluid intake as he states he has not urinated today and his PVR is only 97 cc -Patient has catheter himself in the past, so I have given him 14 [place] coud straight catheters samples for episodes of retention -follow-up in one month for I PSS score, PVR and exam.  2. Elevated PSA Will collect PSA and perform DRE when he returns in one month  3. BPH with LUTS Continue conservative management, avoiding bladder irritants and timed voiding's Restart tamsulosin 0.4 mg daily and dutasteride 0.5 mg daily RTC in 1 month for IPSS, PSA, PVR and exam    Return in about 1 month (around 12/28/2019) for IPSS, PSA, PVR and exam.  Michiel Cowboy, PA-C  [facility] [address] [place], [place] [address] [phone]  I spent 30 minutes on the day of the encounter to include pre-visit record review, face-to-face time with the patient, and post-visit ordering of tests.

## 2019-11-27 ENCOUNTER — Other Ambulatory Visit: Payer: Self-pay

## 2019-11-27 ENCOUNTER — Ambulatory Visit: Payer: Medicare Other | Admitting: Urology

## 2019-11-27 ENCOUNTER — Ambulatory Visit (INDEPENDENT_AMBULATORY_CARE_PROVIDER_SITE_OTHER): Payer: Medicare Other | Admitting: Urology

## 2019-11-27 ENCOUNTER — Encounter: Payer: Self-pay | Admitting: Urology

## 2019-11-27 VITALS — BP 109/71 | HR 81

## 2019-11-27 DIAGNOSIS — R972 Elevated prostate specific antigen [PSA]: Secondary | ICD-10-CM

## 2019-11-27 DIAGNOSIS — N401 Enlarged prostate with lower urinary tract symptoms: Secondary | ICD-10-CM | POA: Diagnosis not present

## 2019-11-27 DIAGNOSIS — N138 Other obstructive and reflux uropathy: Secondary | ICD-10-CM

## 2019-11-27 DIAGNOSIS — R338 Other retention of urine: Secondary | ICD-10-CM

## 2019-11-27 LAB — BLADDER SCAN AMB NON-IMAGING: Scan Result: 97

## 2019-11-27 MED ORDER — DUTASTERIDE 0.5 MG PO CAPS: ORAL_CAPSULE | ORAL | 3 refills | Status: DC

## 2019-11-27 MED ORDER — TAMSULOSIN HCL 0.4 MG PO CAPS: ORAL_CAPSULE | ORAL | 3 refills | Status: DC

## 2019-11-29 ENCOUNTER — Other Ambulatory Visit: Payer: Self-pay

## 2019-11-29 ENCOUNTER — Ambulatory Visit (INDEPENDENT_AMBULATORY_CARE_PROVIDER_SITE_OTHER): Payer: Medicare Other | Admitting: Urology

## 2019-11-29 ENCOUNTER — Encounter: Payer: Self-pay | Admitting: Urology

## 2019-11-29 VITALS — BP 96/61 | HR 74

## 2019-11-29 DIAGNOSIS — R338 Other retention of urine: Secondary | ICD-10-CM | POA: Diagnosis not present

## 2019-11-29 LAB — URINALYSIS, COMPLETE
Bilirubin, UA: NEGATIVE
Glucose, UA: NEGATIVE
Ketones, UA: NEGATIVE
Leukocytes,UA: NEGATIVE
Nitrite, UA: NEGATIVE
Protein,UA: NEGATIVE
Specific Gravity, UA: 1.01 (ref 1.005–1.030)
Urobilinogen, Ur: 0.2 mg/dL (ref 0.2–1.0)
pH, UA: 6 (ref 5.0–7.5)

## 2019-11-29 LAB — MICROSCOPIC EXAMINATION
Bacteria, UA: NONE SEEN
Epithelial Cells (non renal): NONE SEEN /hpf (ref 0–10)

## 2019-11-29 LAB — BLADDER SCAN AMB NON-IMAGING: Scan Result: 622

## 2019-11-29 NOTE — Progress Notes (Signed)
11/29/2019 7:27 PM   Barry Hale 12-03-1945 [id number]  Referring provider: Fonnie Mu, MD No address on file  No chief complaint on file.   HPI: 74 yo WM with urinary retention, BPH and LU TS and an elevated PSA who presents today inability to urinate.    Urinary retention Patient was seen in the ED in November 17, 2019 for inability to urinate.  Foley catheter was placed by ER staff and 1 L of urine was obtained.  Serum creatinine was 0.9 which is baseline.  A was suspicious for infection.  Urine culture grew out Staphylococcus capitis.  He was discharged on Keflex.    Foley catheter was removed two days prior without issue.  He was restarted on his tamsulosin and dutasteride.  His PVR that afternoon was 97 cc.  He does not feel the urge to urinate.  He was given samples of straight caths and was cathing himself 3 times daily, but he ran out of samples earlier this morning.  He has been unable to urinate since 4 AM.  Bladder scan notes a residual of 622 mL.  BPH with retention  patient reports a long history of BPH and underwent TUMT with Dr. Leonette Monarch about 7 years ago. He was on Avodart but stopped that in 2016.  He restarted the tamsulosin and Avodart, but then discontinued them again when the prescription expired and he did not return for an appointment due to Covid.     3) Elevated PSA  PSA Trend  7.6 in 2012 - bx negative  3.1 in 05/2015  6.7 in 10/2016  4.0 in 10/2016  4.2 in 01/2017  4.22 in 08/2017  5.11 in 12/2017    PMH: Past Medical History:  Diagnosis Date  . 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 Asymptomatic carotid artery stenosis 06/05/2012  . 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 BP (high blood pressure) 10/21/2010  . BPH (benign prostatic hyperplasia)   . Elevated PSA   . HLD  (hyperlipidemia)   . HTN (hypertension)   . Stroke (cerebrum) [facility])     Surgical History: Past Surgical History:  Procedure Laterality Date  . artery blockage    . PROSTATE SURGERY      Home Medications:  Allergies as of 11/29/2019   No Known Allergies     Medication List       Accurate as of November 29, 2019 11:59 PM. If you have any questions, ask your nurse or doctor.        amLODipine 10 MG tablet Commonly known as: NORVASC Take 10 mg by mouth daily.   atorvastatin 40 MG tablet Commonly known as: LIPITOR TK 1 T PO QD   dutasteride 0.5 MG capsule Commonly known as: AVODART TAKE 1 CAPSULE(0.5 MG) BY MOUTH DAILY   Klor-Con M10 10 MEQ tablet Generic drug: potassium chloride Take 10 mEq by mouth every evening.   lisinopril 40 MG tablet Commonly known as: ZESTRIL Take by mouth.   lisinopril-hydrochlorothiazide 20-12.5 MG tablet Commonly known as: ZESTORETIC TK 1 T PO QD   sulfamethoxazole-trimethoprim 800-160 MG tablet Commonly known as: BACTRIM DS Take 1 tablet by mouth 2 (two) times daily.   tamsulosin 0.4 MG Caps capsule Commonly known as: FLOMAX TAKE ONE CAPSULE BY MOUTH DAILY AFTER SUPPER       Allergies: No Known Allergies  Family  History: Family History  Problem Relation Age of Onset  . Benign prostatic hyperplasia Other   . Coronary artery disease Other   . Prostate cancer Neg Hx   . Kidney cancer Neg Hx   . Bladder Cancer Neg Hx     Social History:  reports that he has been smoking pipe. He has never used smokeless tobacco. He reports current alcohol use of about 7.0 standard drinks of alcohol per week. He reports that he does not use drugs.  ROS: For pertinent review of systems please refer to history of present illness  Physical Exam: BP 96/61   Pulse 74   Constitutional:  Well nourished. Alert and oriented, No acute distress. HEENT: [place] AT, mask in place.  Trachea midline Cardiovascular: No clubbing, cyanosis, or  edema. Respiratory: Normal respiratory effort, no increased work of breathing. GU: No CVA tenderness.  Bladder is palpable.  Patient with uncircumcised phallus. Foreskin easily retracted  Urethral meatus is patent.  No penile discharge. No penile lesions or rashes. Scrotum without lesions, cysts, rashes and/or edema.   Neurologic: Grossly intact, no focal deficits, moving all 4 extremities. Psychiatric: Normal mood and affect.  Laboratory Data PSA History  See HPI   Urinalysis Component     Latest Ref Rng & Units 11/29/2019  Specific Gravity, UA     1.005 - 1.030 1.010  pH, UA     5.0 - 7.5 6.0  Color, UA     Yellow Yellow  Appearance Ur     Clear Clear  Leukocytes,UA     Negative Negative  Protein,UA     Negative/Trace Negative  Glucose, UA     Negative Negative  Ketones, UA     Negative Negative  RBC, UA     Negative 1+ (A)  Bilirubin, UA     Negative Negative  Urobilinogen, Ur     0.2 - 1.0 mg/dL 0.2  Nitrite, UA     Negative Negative  Microscopic Examination      See below:   Component     Latest Ref Rng & Units 11/29/2019  WBC, UA     0 - 5 /hpf 5-10 (A)  RBC     0 - 2 /hpf 0-2  Epithelial Cells (non renal)     0 - 10 /hpf None seen  Bacteria, UA     None seen/Few None seen   I have reviewed the labs.  Simple Catheter Placement Due to urinary retention patient is present today for a foley cath placement.  Patient was cleaned and prepped in a sterile fashion with betadine. A 16 FR foley catheter was inserted, urine return was noted  800 ml, urine was yellow clear in color.  The balloon was filled with 10cc of sterile water.  A night bag to take home and was given instruction on how to change from one bag to another.  Patient was given instruction on proper catheter care.  Patient tolerated well, no complications were noted   Performed by: Michiel Cowboy, PA-C and Gerarda Gunther, CMA   Assessment & Plan:   1. Urinary retention -Foley replaced  today -He will return in 1 week for a second trial of void -He will continue the tamsulosin and dutasteride  2. Elevated PSA Will collect PSA and perform DRE when he returns in one month  3. BPH with LUTS Continue conservative management, avoiding bladder irritants and timed voiding's Restart tamsulosin 0.4 mg daily and dutasteride 0.5 mg daily RTC in 1 month  for IPSS, PSA, PVR and exam    Return in about 1 week (around 12/06/2019) for TOV.  Michiel Cowboy, PA-C  [facility] [address] [place], [place] [address] [phone]

## 2019-12-04 NOTE — Progress Notes (Signed)
12/05/2019 8:13 AM   Barry Hale [birth year] [id number]  Referring provider: Fonnie Mu, MD No address on file  Chief Complaint  Patient presents with  . Urinary Frequency    HPI: 74 yo WM with urinary retention, BPH and LU TS and an elevated PSA who presents today for a second trial of void.   Urinary retention Patient was seen in the ED in November 17, 2019 for inability to urinate.  Foley catheter was placed by ER staff and 1 L of urine was obtained.  Serum creatinine was 0.9 which is baseline.  A was suspicious for infection.  Urine culture grew out Staphylococcus capitis.  He was discharged on Keflex.    Foley catheter was removed two days prior without issue.  He was restarted on his tamsulosin and dutasteride.  His PVR that afternoon was 97 cc.  He does not feel the urge to urinate.  On 11/29/2019, he was given samples of straight caths and was cathing himself 3 times daily, but he ran out of samples earlier this morning.  He has been unable to urinate since 4 AM.  Bladder scan notes a residual of 622 mL.  Today, patient would like to keep the catheter in until his appointment with me on the 30th in an effort to give the medication more time to take effect.  BPH with retention  patient reports a long history of BPH and underwent TUMT with Dr. Leonette Monarch about 7 years ago. He was on Avodart but stopped that in 2016.  He restarted the tamsulosin and Avodart, but then discontinued them again when the prescription expired and he did not return for an appointment due to Covid.     3) Elevated PSA  PSA Trend  7.6 in 2012 - bx negative  3.1 in 05/2015  6.7 in 10/2016  4.0 in 10/2016  4.2 in 01/2017  4.22 in 08/2017  5.11 in 12/2017    PMH: Past Medical History:  Diagnosis Date  . 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 Asymptomatic carotid artery stenosis 06/05/2012  . 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 BP (high blood pressure) 10/21/2010  . BPH (benign prostatic hyperplasia)   . Elevated PSA   . HLD (hyperlipidemia)   . HTN (hypertension)   . Stroke (cerebrum) [facility])     Surgical History: Past Surgical History:  Procedure Laterality Date  . artery blockage    . PROSTATE SURGERY      Home Medications:  Allergies as of 12/05/2019   No Known Allergies     Medication List       Accurate as of December 05, 2019  8:13 AM. If you have any questions, ask your nurse or doctor.        amLODipine 10 MG tablet Commonly known as: NORVASC Take 10 mg by mouth daily.   atorvastatin 40 MG tablet Commonly known as: LIPITOR TK 1 T PO QD   dutasteride 0.5 MG capsule Commonly known as: AVODART TAKE 1 CAPSULE(0.5 MG) BY MOUTH DAILY   Klor-Con M10 10 MEQ tablet Generic drug: potassium chloride Take 10 mEq by mouth every evening.   lisinopril 40 MG tablet Commonly known as: ZESTRIL Take by mouth.   lisinopril-hydrochlorothiazide 20-12.5 MG tablet Commonly known as: ZESTORETIC TK 1 T PO QD   sulfamethoxazole-trimethoprim 800-160 MG tablet Commonly known as: BACTRIM  DS Take 1 tablet by mouth 2 (two) times daily.   tamsulosin 0.4 MG Caps capsule Commonly known as: FLOMAX TAKE ONE CAPSULE BY MOUTH DAILY AFTER SUPPER       Allergies: No Known Allergies  Family History: Family History  Problem Relation Age of Onset  . Benign prostatic hyperplasia Other   . Coronary artery disease Other   . Prostate cancer Neg Hx   . Kidney cancer Neg Hx   . Bladder Cancer Neg Hx     Social History:  reports that he has been smoking pipe. He has never used smokeless tobacco. He reports current alcohol use of about 7.0 standard drinks of alcohol per week. He reports that he does not use drugs.  ROS: For pertinent review of systems please refer to history of present  illness  Physical Exam: BP 109/69   Pulse 81   Ht 5\' 9"  (1.753 m)   Wt 185 lb (83.9 kg)   BMI 27.32 kg/m   Constitutional:  Well nourished. Alert and oriented, No acute distress. HEENT: [place] AT, mask in place.  Trachea midline Cardiovascular: No clubbing, cyanosis, or edema. Respiratory: Normal respiratory effort, no increased work of breathing. Neurologic: Grossly intact, no focal deficits, moving all 4 extremities. Psychiatric: Normal mood and affect.  Laboratory Data PSA History  See HPI   Urinalysis Component     Latest Ref Rng & Units 11/29/2019  Specific Gravity, UA     1.005 - 1.030 1.010  pH, UA     5.0 - 7.5 6.0  Color, UA     Yellow Yellow  Appearance Ur     Clear Clear  Leukocytes,UA     Negative Negative  Protein,UA     Negative/Trace Negative  Glucose, UA     Negative Negative  Ketones, UA     Negative Negative  RBC, UA     Negative 1+ (A)  Bilirubin, UA     Negative Negative  Urobilinogen, Ur     0.2 - 1.0 mg/dL 0.2  Nitrite, UA     Negative Negative  Microscopic Examination      See below:   Component     Latest Ref Rng & Units 11/29/2019  WBC, UA     0 - 5 /hpf 5-10 (A)  RBC     0 - 2 /hpf 0-2  Epithelial Cells (non renal)     0 - 10 /hpf None seen  Bacteria, UA     None seen/Few None seen   I have reviewed the labs.   Assessment & Plan:   1. Urinary retention -Patient will have Foley in until his appointment on the 30th He will contact the office if the Foley catheter becomes bothersome and he would like it removed sooner -He will continue the tamsulosin and dutasteride  2. Elevated PSA Will collect PSA and perform DRE when he returns in one month  3. BPH with LUTS Continue conservative management, avoiding bladder irritants and timed voiding's Restart tamsulosin 0.4 mg daily and dutasteride 0.5 mg daily RTC in 1 month for IPSS, PSA, PVR and exam    Return for Keep appointment on the 30th.  31,  PA-C  [facility] [address] [place], [place] [phone]

## 2019-12-05 ENCOUNTER — Encounter: Payer: Self-pay | Admitting: Urology

## 2019-12-05 ENCOUNTER — Ambulatory Visit (INDEPENDENT_AMBULATORY_CARE_PROVIDER_SITE_OTHER): Payer: Medicare Other | Admitting: Urology

## 2019-12-05 ENCOUNTER — Other Ambulatory Visit: Payer: Self-pay

## 2019-12-05 ENCOUNTER — Ambulatory Visit: Payer: Self-pay | Admitting: Urology

## 2019-12-05 VITALS — BP 109/69 | HR 81 | Ht 69.0 in | Wt 185.0 lb

## 2019-12-05 DIAGNOSIS — R338 Other retention of urine: Secondary | ICD-10-CM

## 2019-12-05 DIAGNOSIS — N138 Other obstructive and reflux uropathy: Secondary | ICD-10-CM

## 2019-12-05 DIAGNOSIS — R972 Elevated prostate specific antigen [PSA]: Secondary | ICD-10-CM | POA: Diagnosis not present

## 2019-12-05 DIAGNOSIS — N401 Enlarged prostate with lower urinary tract symptoms: Secondary | ICD-10-CM

## 2019-12-30 NOTE — Progress Notes (Signed)
12/31/2019 1:56 PM   Barry Hale 1945/06/25 [id number]  Referring provider: Fonnie Mu, MD No address on file  Chief Complaint  Patient presents with  . Urinary Retention    HPI: 74 yo WM with urinary retention, BPH and LU TS and an elevated PSA who presents today for a second trial of void.   Urinary retention Patient was seen in the ED in November 17, 2019 for inability to urinate.  Foley catheter was placed by ER staff and 1 L of urine was obtained.  Serum creatinine was 0.9 which is baseline.  A was suspicious for infection.  Urine culture grew out Staphylococcus capitis.  He was discharged on Keflex.    Foley catheter was removed two days prior without issue.  He was restarted on his tamsulosin and dutasteride.  His PVR that afternoon was 97 cc.  He does not feel the urge to urinate.  On 11/29/2019, he was given samples of straight caths and was cathing himself 3 times daily, but he ran out of samples earlier this morning.  He has been unable to urinate since 4 AM.  Bladder scan notes a residual of 622 mL.  At his visit on 12/05/2019, he decided to keep the catheter in until his appointment with me on the 30th in an effort to give the medication more time to take effect.  Catheter is removed today.  BPH with retention  patient reports a long history of BPH and underwent TUMT with Dr. Leonette Monarch about 7 years ago. He was on Avodart but stopped that in 2016.  He restarted the tamsulosin and Avodart, but then discontinued them again when the prescription expired and he did not return for an appointment due to Covid.     3) Elevated PSA  PSA Trend  7.6 in 2012 - bx negative  3.1 in 05/2015  6.7 in 10/2016  4.0 in 10/2016  4.2 in 01/2017  4.22 in 08/2017  5.11 in 12/2017    PMH: Past Medical History:  Diagnosis Date  . 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 TIA in 01/2009 with near-total occlusion of left  internal carotid; underwent carotid endarterectomy   . Asymptomatic carotid artery stenosis 06/05/2012  . 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 BP (high blood pressure) 10/21/2010  . BPH (benign prostatic hyperplasia)   . Elevated PSA   . HLD (hyperlipidemia)   . HTN (hypertension)   . Stroke (cerebrum) [facility])     Surgical History: Past Surgical History:  Procedure Laterality Date  . artery blockage    . PROSTATE SURGERY      Home Medications:  Allergies as of 12/31/2019   No Known Allergies     Medication List       Accurate as of December 31, 2019  1:56 PM. If you have any questions, ask your nurse or doctor.        STOP taking these medications   lisinopril-hydrochlorothiazide 20-12.5 MG tablet Commonly known as: ZESTORETIC Stopped by: Michiel Cowboy, PA-C     TAKE these medications   amLODipine 10 MG tablet Commonly known as: NORVASC Take 10 mg by mouth daily.   atorvastatin 40 MG tablet Commonly known as: LIPITOR TK 1 T PO QD   dutasteride 0.5 MG capsule Commonly known as: AVODART TAKE 1 CAPSULE(0.5 MG) BY MOUTH DAILY   Klor-Con M10 10 MEQ tablet Generic drug: potassium chloride Take 10 mEq by mouth every evening.  lisinopril 40 MG tablet Commonly known as: ZESTRIL Take by mouth.   sulfamethoxazole-trimethoprim 800-160 MG tablet Commonly known as: BACTRIM DS Take 1 tablet by mouth 2 (two) times daily.   tamsulosin 0.4 MG Caps capsule Commonly known as: FLOMAX TAKE ONE CAPSULE BY MOUTH DAILY AFTER SUPPER       Allergies: No Known Allergies  Family History: Family History  Problem Relation Age of Onset  . Benign prostatic hyperplasia Other   . Coronary artery disease Other   . Prostate cancer Neg Hx   . Kidney cancer Neg Hx   . Bladder Cancer Neg Hx     Social History:  reports that he has been smoking pipe. He has never used smokeless tobacco. He reports current alcohol use of about 7.0 standard drinks of alcohol per week. He reports  that he does not use drugs.  ROS: For pertinent review of systems please refer to history of present illness  Physical Exam: BP 137/69   Pulse 71   Ht 5\' 9"  (1.753 m)   Wt 185 lb (83.9 kg)   BMI 27.32 kg/m   Constitutional:  Well nourished. Alert and oriented, No acute distress. HEENT: [place] AT, mask in place.  Trachea midline Cardiovascular: No clubbing, cyanosis, or edema. Respiratory: Normal respiratory effort, no increased work of breathing. GU: No CVA tenderness.  No bladder fullness or masses.  Patient uncircumcised phallus. Foreskin easily retracted Urethral meatus is patent.  No penile discharge. No penile lesions or rashes. Scrotum without lesions, cysts, rashes and/or edema.   Lymph: No inguinal adenopathy. Neurologic: Grossly intact, no focal deficits, moving all 4 extremities. Psychiatric: Normal mood and affect.  Laboratory Data PSA History  See HPI   Urinalysis Component     Latest Ref Rng & Units 11/29/2019  Specific Gravity, UA     1.005 - 1.030 1.010  pH, UA     5.0 - 7.5 6.0  Color, UA     Yellow Yellow  Appearance Ur     Clear Clear  Leukocytes,UA     Negative Negative  Protein,UA     Negative/Trace Negative  Glucose, UA     Negative Negative  Ketones, UA     Negative Negative  RBC, UA     Negative 1+ (A)  Bilirubin, UA     Negative Negative  Urobilinogen, Ur     0.2 - 1.0 mg/dL 0.2  Nitrite, UA     Negative Negative  Microscopic Examination      See below:   Component     Latest Ref Rng & Units 11/29/2019  WBC, UA     0 - 5 /hpf 5-10 (A)  RBC     0 - 2 /hpf 0-2  Epithelial Cells (non renal)     0 - 10 /hpf None seen  Bacteria, UA     None seen/Few None seen   I have reviewed the labs.  Catheter Removal  Patient is present today for a catheter removal.  9 ml of water was drained from the balloon. A 16 FR foley cath was removed from the bladder no complications were noted . Patient tolerated well.  Performed by: 12/01/2019,  PA-C  Assessment & Plan:   1. Urinary retention -Foley removed at today's visit -Patient is well versed in self-catheterization, so I gave him samples of self caths (75F straight) in case he is unable to urinate on his own -He will continue the tamsulosin and dutasteride -He will follow up in  one month for PVR  2. Elevated PSA Will collect PSA and perform DRE when he returns in one month  3. BPH with LUTS Continue conservative management, avoiding bladder irritants and timed voiding's Restart tamsulosin 0.4 mg daily and dutasteride 0.5 mg daily RTC in 1 month for IPSS, UA, PSA, PVR and exam    Return in about 1 month (around 01/30/2020) for UA, PSA, I PSS, PVR and exam .  Michiel Cowboy, PA-C  [facility] [address] [place], [place] [address] [phone]

## 2019-12-31 ENCOUNTER — Ambulatory Visit (INDEPENDENT_AMBULATORY_CARE_PROVIDER_SITE_OTHER): Payer: Medicare Other | Admitting: Urology

## 2019-12-31 ENCOUNTER — Other Ambulatory Visit: Payer: Self-pay

## 2019-12-31 ENCOUNTER — Encounter: Payer: Self-pay | Admitting: Urology

## 2019-12-31 VITALS — BP 137/69 | HR 71 | Ht 69.0 in | Wt 185.0 lb

## 2019-12-31 DIAGNOSIS — R338 Other retention of urine: Secondary | ICD-10-CM

## 2020-01-30 ENCOUNTER — Other Ambulatory Visit: Payer: Self-pay

## 2020-01-30 DIAGNOSIS — N401 Enlarged prostate with lower urinary tract symptoms: Secondary | ICD-10-CM

## 2020-02-03 ENCOUNTER — Encounter: Payer: Self-pay | Admitting: Urology

## 2020-02-03 ENCOUNTER — Other Ambulatory Visit: Payer: Self-pay

## 2020-02-03 ENCOUNTER — Other Ambulatory Visit
Admission: RE | Admit: 2020-02-03 | Discharge: 2020-02-03 | Disposition: A | Discharge status: Home / Self Care | Payer: Medicare Other | Attending: Urology | Admitting: Urology

## 2020-02-03 ENCOUNTER — Ambulatory Visit (INDEPENDENT_AMBULATORY_CARE_PROVIDER_SITE_OTHER): Payer: Medicare Other | Admitting: Urology

## 2020-02-03 VITALS — BP 124/68 | HR 73 | Ht 70.0 in | Wt 185.0 lb

## 2020-02-03 DIAGNOSIS — N401 Enlarged prostate with lower urinary tract symptoms: Secondary | ICD-10-CM

## 2020-02-03 DIAGNOSIS — N138 Other obstructive and reflux uropathy: Secondary | ICD-10-CM | POA: Diagnosis not present

## 2020-02-03 DIAGNOSIS — R972 Elevated prostate specific antigen [PSA]: Secondary | ICD-10-CM | POA: Diagnosis not present

## 2020-02-03 LAB — URINALYSIS, COMPLETE (UACMP) WITH MICROSCOPIC
Bilirubin Urine: NEGATIVE
Glucose, UA: NEGATIVE mg/dL
Hgb urine dipstick: NEGATIVE
Ketones, ur: NEGATIVE mg/dL
Leukocytes,Ua: NEGATIVE
Nitrite: NEGATIVE
Protein, ur: NEGATIVE mg/dL
Specific Gravity, Urine: 1.02 (ref 1.005–1.030)
Squamous Epithelial / HPF: NONE SEEN (ref 0–5)
pH: 6 (ref 5.0–8.0)

## 2020-02-03 LAB — BLADDER SCAN AMB NON-IMAGING

## 2020-02-03 NOTE — Progress Notes (Signed)
02/03/2020 2:27 PM   Barry Hale 1945-07-17 [id number]  Referring provider: Fonnie Mu, MD No address on file  Chief Complaint  Patient presents with  . Benign Prostatic Hypertrophy    Follow up    HPI: 75 yo WM with urinary retention, BPH and LU TS and an elevated PSA who presents today for a one month follow up for urinary retention.   Urinary retention Patient was seen in the ED in November 17, 2019 for inability to urinate.  Foley catheter was placed by ER staff and 1 L of urine was obtained.  Serum creatinine was 0.9 which is baseline.  A was suspicious for infection.  Urine culture grew out Staphylococcus capitis.  He was discharged on Keflex.    Foley catheter was removed two days prior without issue.  He was restarted on his tamsulosin and dutasteride.  His PVR that afternoon was 97 cc.  He does not feel the urge to urinate.  On 11/29/2019, he was given samples of straight caths and was cathing himself 3 times daily, but he ran out of samples earlier this morning.  He has been unable to urinate since 4 AM.  Bladder scan notes a residual of 622 mL.  At his visit on 12/05/2019, he decided to keep the catheter in until his appointment with me on the 30th in an effort to give the medication more time to take effect.  Catheter removed 12/31/2019.  BPH with retention I PSS score: 12/3   PVR: 72 mL  Patient reports a long history of BPH and underwent TUMT with Dr. Leonette Monarch > 10 years ago.  He is currently taking tamsulosin and Avodart    IPSS    Row Name 02/03/20 1400         International Prostate Symptom Score   How often have you had the sensation of not emptying your bladder? About half the time     How often have you had to urinate less than every two hours? Less than 1 in 5 times     How often have you found you stopped and started again several times when you urinated? Less than half the time     How often have you found it difficult to postpone  urination? Not at All     How often have you had a weak urinary stream? About half the time     How often have you had to strain to start urination? Not at All     How many times did you typically get up at night to urinate? 3 Times     Total IPSS Score 12           Quality of Life due to urinary symptoms   If you were to spend the rest of your life with your urinary condition just the way it is now how would you feel about that? Mixed           Elevated PSA PSA Trend  7.6 in 2012 - bx negative  3.1 in 05/2015  6.7 in 10/2016  4.0 in 10/2016  4.2 in 01/2017  4.22 in 08/2017  5.11 in 12/2017    PMH: Past Medical History:  Diagnosis Date  . 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 Asymptomatic carotid artery stenosis 06/05/2012  . 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 BP (  high blood pressure) 10/21/2010  . BPH (benign prostatic hyperplasia)   . Elevated PSA   . HLD (hyperlipidemia)   . HTN (hypertension)   . Stroke (cerebrum) [facility])     Surgical History: Past Surgical History:  Procedure Laterality Date  . artery blockage    . PROSTATE SURGERY      Home Medications:  Allergies as of 02/03/2020   No Known Allergies     Medication List       Accurate as of February 03, 2020  2:27 PM. If you have any questions, ask your nurse or doctor.        STOP taking these medications   sulfamethoxazole-trimethoprim 800-160 MG tablet Commonly known as: BACTRIM DS Stopped by: Morrie Daywalt, PA-C     TAKE these medications   amLODipine 10 MG tablet Commonly known as: NORVASC Take 10 mg by mouth daily.   atorvastatin 40 MG tablet Commonly known as: LIPITOR TK 1 T PO QD   dutasteride 0.5 MG capsule Commonly known as: AVODART TAKE 1 CAPSULE(0.5 MG) BY MOUTH DAILY   Klor-Con M10 10 MEQ tablet Generic drug:  potassium chloride Take 10 mEq by mouth every evening.   lisinopril 40 MG tablet Commonly known as: ZESTRIL Take by mouth.   tamsulosin 0.4 MG Caps capsule Commonly known as: FLOMAX TAKE ONE CAPSULE BY MOUTH DAILY AFTER SUPPER       Allergies: No Known Allergies  Family History: Family History  Problem Relation Age of Onset  . Benign prostatic hyperplasia Other   . Coronary artery disease Other   . Prostate cancer Neg Hx   . Kidney cancer Neg Hx   . Bladder Cancer Neg Hx     Social History:  reports that he has been smoking pipe. He has never used smokeless tobacco. He reports current alcohol use of about 7.0 standard drinks of alcohol per week. He reports that he does not use drugs.  ROS: For pertinent review of systems please refer to history of present illness  Physical Exam: BP 124/68   Pulse 73   Ht 5\' 10"  (1.778 m)   Wt 185 lb (83.9 kg)   BMI 26.54 kg/m   Constitutional:  Well nourished. Alert and oriented, No acute distress. HEENT: [place] AT, mask in place.  Trachea midline Cardiovascular: No clubbing, cyanosis, or edema. Respiratory: Normal respiratory effort, no increased work of breathing. GU: No CVA tenderness.  No bladder fullness or masses.  Patient with uncircumcised phallus.  Foreskin easily retracted   Urethral meatus is patent.  No penile discharge. No penile lesions or rashes. Scrotum without lesions, cysts, rashes and/or edema.  Testicles are located scrotally bilaterally. No masses are appreciated in the testicles. Left and right epididymis are normal. Rectal: Patient with  normal sphincter tone. Anus and perineum without scarring or rashes. No rectal masses are appreciated. Prostate is approximately 60 +grams, could only palpate the apex, no nodules are appreciated. Seminal vesicles could not be palpated Neurologic: Grossly intact, no focal deficits, moving all 4 extremities. Psychiatric: Normal mood and affect.   Laboratory Data PSA History  See  HPI   Urinalysis Component     Latest Ref Rng & Units 02/03/2020  Color, Urine     YELLOW YELLOW  Appearance     CLEAR CLEAR  Specific Gravity, Urine     1.005 - 1.030 1.020  pH     5.0 - 8.0 6.0  Glucose, UA     NEGATIVE mg/dL NEGATIVE  Hgb urine  dipstick     NEGATIVE NEGATIVE  Bilirubin Urine     NEGATIVE NEGATIVE  Ketones, ur     NEGATIVE mg/dL NEGATIVE  Protein     NEGATIVE mg/dL NEGATIVE  Nitrite     NEGATIVE NEGATIVE  Leukocytes,Ua     NEGATIVE NEGATIVE  RBC / HPF     0 - 5 RBC/hpf 0-5  WBC, UA     0 - 5 WBC/hpf 0-5  Bacteria, UA     NONE SEEN RARE (A)  Squamous Epithelial / LPF     0 - 5 NONE SEEN  Mucus      PRESENT  I have reviewed the labs.  Pertinent Imaging Results for JAIRE, PINKHAM (MRN [id number]) as of 02/03/2020 14:20  Ref. Range 02/03/2020 14:19  Scan Result Unknown 13ml    Assessment & Plan:   1. Urinary retention - Resolved, but patient is wanting to pursue a bladder outlet procedure ASAP to prevent further episodes of retention in the future - if PSA is elevated - will obtain a prostate MRI - if PSA is stable - will schedule cysto/TRUSP for evaluation for a bladder outlet procedure  2. Elevated PSA - PSA pending  3. BPH with LUTS IPSS score is 12/3 Continue conservative management, avoiding bladder irritants and timed voiding's Continue tamsulosin 0.4 mg daily and dutasteride 0.5 mg daily Will most likely have a bladder outlet procedure in the future  Return for Pending PSA results .  Zara Council, PA-C  [facility] [address] [place], [place] [address] [phone]

## 2020-02-04 ENCOUNTER — Other Ambulatory Visit: Payer: Self-pay | Admitting: Urology

## 2020-02-04 ENCOUNTER — Telehealth: Payer: Self-pay | Admitting: Urology

## 2020-02-04 LAB — PSA: Prostatic Specific Antigen: 9.14 ng/mL — ABNORMAL HIGH (ref 0.00–4.00)

## 2020-02-04 NOTE — Telephone Encounter (Signed)
Spoke to patient and he has a Neurosurgeon, it was placed at [facility].

## 2020-02-04 NOTE — Telephone Encounter (Signed)
-----   Message from Harle Battiest, PA-C sent at 02/04/2020  9:24 AM EST ----- Please let Mr. Misener know that his PSA returned at 9.14, so we will go ahead with the prostate MRI.

## 2020-02-04 NOTE — Telephone Encounter (Signed)
LMOM for patient to return call.

## 2020-02-04 NOTE — Telephone Encounter (Signed)
I will need to know the type of pacemaker his has to see if he can have the prostate MRI as well.

## 2020-02-10 ENCOUNTER — Other Ambulatory Visit: Payer: Self-pay | Admitting: Urology

## 2020-02-10 DIAGNOSIS — R972 Elevated prostate specific antigen [PSA]: Secondary | ICD-10-CM

## 2020-02-10 DIAGNOSIS — N138 Other obstructive and reflux uropathy: Secondary | ICD-10-CM

## 2020-02-10 NOTE — Progress Notes (Unsigned)
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   He will need to have it done at [facility].

## 2020-03-02 ENCOUNTER — Other Ambulatory Visit: Payer: Self-pay

## 2020-03-02 ENCOUNTER — Ambulatory Visit (HOSPITAL_COMMUNITY)
Admission: RE | Admit: 2020-03-02 | Discharge: 2020-03-02 | Disposition: A | Discharge status: Home / Self Care | Payer: Medicare Other | Source: Ambulatory Visit | Attending: Urology | Admitting: Urology

## 2020-03-02 ENCOUNTER — Ambulatory Visit (HOSPITAL_COMMUNITY)
Admission: RE | Admit: 2020-03-02 | Discharge: 2020-03-02 | Disposition: A | Discharge status: Home / Self Care | Payer: Medicare Other | Source: Ambulatory Visit | Attending: Physician Assistant | Admitting: Physician Assistant

## 2020-03-02 DIAGNOSIS — N401 Enlarged prostate with lower urinary tract symptoms: Secondary | ICD-10-CM | POA: Insufficient documentation

## 2020-03-02 DIAGNOSIS — N138 Other obstructive and reflux uropathy: Secondary | ICD-10-CM | POA: Insufficient documentation

## 2020-03-02 DIAGNOSIS — Z950 Presence of cardiac pacemaker: Secondary | ICD-10-CM | POA: Insufficient documentation

## 2020-03-02 DIAGNOSIS — R972 Elevated prostate specific antigen [PSA]: Secondary | ICD-10-CM | POA: Insufficient documentation

## 2020-03-02 MED ORDER — GADOBUTROL 1 MMOL/ML IV SOLN
8.0000 mL | Freq: Once | INTRAVENOUS | Status: AC | PRN
  Administered 2020-03-02: 8 mL via INTRAVENOUS

## 2020-03-02 NOTE — Progress Notes (Signed)
[facility] sent. Orders received for VOO 70

## 2020-03-17 ENCOUNTER — Ambulatory Visit: Payer: Medicare Other | Admitting: Urology

## 2020-03-24 ENCOUNTER — Other Ambulatory Visit: Payer: Self-pay

## 2020-03-24 ENCOUNTER — Ambulatory Visit (INDEPENDENT_AMBULATORY_CARE_PROVIDER_SITE_OTHER): Payer: Medicare Other | Admitting: Urology

## 2020-03-24 ENCOUNTER — Encounter: Payer: Self-pay | Admitting: Urology

## 2020-03-24 VITALS — BP 106/62 | HR 62 | Ht 69.5 in | Wt 180.0 lb

## 2020-03-24 DIAGNOSIS — R972 Elevated prostate specific antigen [PSA]: Secondary | ICD-10-CM | POA: Diagnosis not present

## 2020-03-24 DIAGNOSIS — N401 Enlarged prostate with lower urinary tract symptoms: Secondary | ICD-10-CM | POA: Diagnosis not present

## 2020-03-24 DIAGNOSIS — N138 Other obstructive and reflux uropathy: Secondary | ICD-10-CM | POA: Diagnosis not present

## 2020-03-24 LAB — BLADDER SCAN AMB NON-IMAGING: Scan Result: 0

## 2020-03-24 NOTE — Patient Instructions (Signed)

## 2020-03-24 NOTE — Progress Notes (Signed)
   03/24/2020 1:06 PM   Barry Hale Oct 08, 1945 [id number]  Reason for visit: BPH/retention, discuss HoLEP, elevated PSA  HPI: I saw [name] in urology clinic today for the above issues.  He is a 75 year old male with a long history of BPH on maximal medical therapy with dutasteride and Flomax, elevated PSA, and multiple episodes of urinary retention over the last 4 to 5 years.  He has been followed by our PA Michiel Cowboy most recently, and underwent a prostate MRI for an elevated PSA.I personally viewed and interpreted the prostate MRI from 03/02/20 that shows 110g gland with no suspicious lesions.  Dr Mena Goes performed cystoscopy in 2018 that showed no strictures, lateral lobe hypertrophy R>L, no suspicious lesions, and no median lobe.  He is interested in discussing outlet procedures for his ongoing urinary symptoms and history of retention.  His primary complaint is weak stream and urinary dribbling with difficulty voiding, especially in the morning.  IPSS score today is 17, with quality of life next.  We reviewed that with his 110 g prostate he would not be a candidate for UroLift, but HOLEP would be the best option for an outlet procedure.  We discussed the risks and benefits of HoLEP at length.  The procedure requires general anesthesia and takes 2 to 3 hours, and a holmium laser is used to enucleate the prostate and push this tissue into the bladder.  A morcellator is then used to remove this tissue, which is sent for pathology.  The vast majority of patients are able to discharge the same day with a catheter in place for 2 to 3 days, and will follow-up in clinic for a voiding trial.  Approximately 5% of patients will be admitted overnight to monitor the urine, or if they have multiple co-morbidities.  We specifically discussed the risks of bleeding, infection, retrograde ejaculation, temporary urgency and urge incontinence, very low risk of long-term incontinence, pathologic  evaluation of prostate tissue and possible detection of prostate cancer or other malignancy, and possible need for additional procedures.  Will call to schedule HoLEP, he would like to discuss further with his PCP prior to scheduling a surgery date  Sondra Come, MD  [facility] [address], [address] [place], [place] [address] [phone]

## 2020-04-06 NOTE — Progress Notes (Signed)
error 

## 2020-04-10 ENCOUNTER — Telehealth: Payer: Self-pay | Admitting: Urology

## 2020-04-10 NOTE — Telephone Encounter (Signed)
Pt. Returned my phone call to discuss surgery. Pt. States he has a follow up appointment with his PCP on 05/04/20 and will discuss surgery with him at that appointment. Pt. Will call back to let us know if/when he wants to have surgery.

## 2020-10-31 ENCOUNTER — Other Ambulatory Visit: Payer: Self-pay | Admitting: Urology

## 2021-11-02 ENCOUNTER — Other Ambulatory Visit: Payer: Self-pay | Admitting: Urology

## 2021-11-15 ENCOUNTER — Telehealth: Payer: Self-pay | Admitting: *Deleted

## 2021-11-15 NOTE — Patient Outreach (Signed)
  Care Coordination   11/15/2021 Name: Barry Hale MRN: [id number] DOB: 14-Aug-1945   Care Coordination Outreach Attempts:  An unsuccessful telephone outreach was attempted today to offer the patient information about available care coordination services as a benefit of their health plan.   Follow Up Plan:  Additional outreach attempts will be made to offer the patient care coordination information and services.   Encounter Outcome:  No Answer  Care Coordination Interventions Activated:  No   Care Coordination Interventions:  No, not indicated    Jacqlyn Larsen [facility] [place] RN Care Coordinator [phone]

## 2021-11-16 ENCOUNTER — Telehealth: Payer: Self-pay | Admitting: *Deleted

## 2021-11-16 ENCOUNTER — Encounter: Payer: Self-pay | Admitting: *Deleted

## 2021-11-16 NOTE — Patient Outreach (Signed)
  Care Coordination   Initial Visit Note   11/16/2021 Name: Barry Hale MRN: [id number] DOB: [birth year]  Barry Hale is a 76 y.o. year old male who sees Jordan Likes, MD for primary care. I spoke with  Filomena Jungling by phone today.  What matters to the patients health and wellness today?  "Help with issue of my medicines keep being put on hold"    Goals Addressed               This Visit's Progress     "help with issue of my medicines keep being put on hold" (pt-stated)        Care Coordination Interventions: Patient interviewed about adult health maintenance status including  importance of Annual Wellness Visit, pt states " I do have this every year" Provided education about importance of taking medications as prescribed Evaluation of current treatment plan related to hypertension self management and patient's adherence to plan as established by provider Discussed complications of poorly controlled blood pressure such as heart disease, stroke, circulatory complications, vision complications, kidney impairment, sexual dysfunction Assessed social determinant of health barriers Patient reports he is managing well at home with the exception of "my medicines being on hold despite having refills"  pt states he has been out of amlodipine for 3 days now and has had the same issue with lisinopril in the past, pt states he takes medication as prescribed and has no idea why medications are placed on hold, would like outreach from pharmacist for assistance. Patient reports he monitors blood pressure at home. Care coordination program explained to patient who is agreeable with today's outreach from RN care coordinator but declines further outreach from RN, is agreeable to outreach from pharmacist.          SDOH assessments and interventions completed:  Yes  SDOH Interventions Today    Flowsheet Row Most Recent Value  SDOH Interventions   Food Insecurity Interventions  Intervention Not Indicated  Transportation Interventions Intervention Not Indicated        Care Coordination Interventions Activated:  Yes  Care Coordination Interventions:  Yes, provided   Follow up plan: Referral made to pharmacist for assistance with issue of patient's medications being placed on hold and patient does not have the medication to take    Encounter Outcome:  Pt. Visit Completed   Jacqlyn Larsen [facility] [place] RN Care Coordinator [phone]

## 2021-11-17 ENCOUNTER — Telehealth: Payer: Self-pay | Admitting: *Deleted

## 2021-11-17 NOTE — Telephone Encounter (Signed)
Faxed [facility]   Simone Rodenbeck  Care Coordination Care Guide  Direct Dial: [phone]  

## 2021-11-17 NOTE — Chronic Care Management (AMB) (Signed)
  Care Coordination   Note   11/17/2021 Name: Daryl Beehler MRN: [id number] DOB: Oct 21, 1945  Daxson Reffett is a 76 y.o. year old male who sees Jordan Likes, MD for primary care. I reached out to [facility] by phone today to make aware per [place] she verified with [facility] that the following prescriptions: amlodipine, Lisinopril and Atorvastatin are waiting to be picked up at [place], [place] [address] AT [address]. Patient understood and thanked me for the call.   [place]  Direct Dial: [phone]

## 2021-12-17 NOTE — Progress Notes (Unsigned)
12/20/2021 11:19 AM   Barry Hale 03-25-1945 [id number]  Referring provider: Fonnie Mu, MD No address on file  Urological history: 1.  Urinary retention -2021  2. BPH w/LU TS -prostate volume 110 grams -I PSS 21/4 -PVR 120 mL -Tamsulosin 0.4 mg daily and dutasteride 0.5 mg daily  3. Elevated PSA -biopsy 2012 for PSA 7.6 - NED  Chief Complaint  Patient presents with   Benign Prostatic Hypertrophy    HPI: Barry Hale is a 76 y.o. male who presents today for yearly visit.    "My blasted prostate is blocked again."  He has been without his medications since August or September.  He is having dysuria, intermittency, hesitancy and feeling of incomplete bladder emptying.  Patient denies any modifying or aggravating factors.  Patient denies any gross hematuria, dysuria or suprapubic/flank pain.  Patient denies any fevers, chills, nausea or vomiting.    He had to cath himself Thursday night, Friday night and Saturday night.  He states he got out a lot.   PVR 120 mL    IPSS     Row Name 12/20/21 1100         International Prostate Symptom Score   How often have you had the sensation of not emptying your bladder? More than half the time     How often have you had to urinate less than every two hours? Less than half the time     How often have you found you stopped and started again several times when you urinated? About half the time     How often have you found it difficult to postpone urination? About half the time     How often have you had a weak urinary stream? About half the time     How often have you had to strain to start urination? About half the time     How many times did you typically get up at night to urinate? 3 Times     Total IPSS Score 21       Quality of Life due to urinary symptoms   If you were to spend the rest of your life with your urinary condition just the way it is now how would you feel about that? 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 TIA in 01/2009 with near-total occlusion of left internal carotid; underwent carotid endarterectomy    Asymptomatic carotid artery stenosis 06/05/2012   Benign fibroma of prostate 10/21/2010   BP (high blood pressure) 10/21/2010   BPH (benign prostatic hyperplasia)    Elevated PSA    HLD (hyperlipidemia)    HTN (hypertension)    Stroke (cerebrum) [facility])     Surgical History: Past Surgical History:  Procedure Laterality Date   artery blockage     PROSTATE SURGERY      Home Medications:  Allergies as of 12/20/2021   No Known Allergies      Medication List        Accurate as of December 20, 2021 11:19 AM. If you have any questions, ask your nurse or doctor.          STOP taking these medications    Klor-Con M10 10 MEQ tablet Generic drug: potassium chloride Stopped  by: [name], PA-C       TAKE these medications    amLODipine 10 MG tablet Commonly known as: NORVASC Take 10 mg by mouth daily.   atorvastatin 40 MG tablet Commonly known as: LIPITOR Take 40 mg by mouth daily.   dutasteride 0.5 MG capsule Commonly known as: AVODART TAKE 1 CAPSULE(0.5 MG) BY MOUTH DAILY   lisinopril 40 MG tablet Commonly known as: ZESTRIL Take 40 mg by mouth daily.   potassium chloride 10 MEQ tablet Commonly known as: KLOR-CON Take by mouth.   tamsulosin 0.4 MG Caps capsule Commonly known as: FLOMAX TAKE 1 CAPSULE BY MOUTH DAILY AFTER SUPPER        Allergies: No Known Allergies  Family History: Family History  Problem Relation Age of Onset   Benign prostatic hyperplasia Other    Coronary artery disease Other    Prostate cancer Neg Hx    Kidney cancer Neg Hx    Bladder Cancer Neg Hx     Social History:  reports that he has been smoking pipe. He has  never used smokeless tobacco. He reports current alcohol use of about 7.0 standard drinks of alcohol per week. He reports that he does not use drugs.  ROS: Pertinent ROS in HPI  Physical Exam: BP 119/71 (BP Location: Left Arm, Patient Position: Sitting, Cuff Size: Normal)   Pulse 70   Ht 5' 9.5" (1.765 m)   Wt 168 lb (76.2 kg)   BMI 24.45 kg/m   Constitutional:  Well nourished. Alert and oriented, No acute distress. HEENT: [place] AT, moist mucus membranes.  Trachea midline Cardiovascular: No clubbing, cyanosis, or edema. Respiratory: Normal respiratory effort, no increased work of breathing. Neurologic: Grossly intact, no focal deficits, moving all 4 extremities. Psychiatric: Normal mood and affect.  Laboratory Data: Serum creatinine of 1.1 in April 2023 I have reviewed the labs.   Pertinent Imaging:  12/20/21 11:29  Scan Result    Assessment & Plan:    1. BPH with LU TS -Worsening urinary symptoms since being off the medication -PVR < 300 cc  -most bothersome symptoms are hesitancy, intermittency, feeling of complete bladder emptying -continue conservative management, avoiding bladder irritants and timed voiding's -restart tamsulosin 0.4 mg daily and dutasteride 0.5 mg daily -also gave him straight cath samples in case he goes into frank retention in the interim  No follow-ups on file.  These notes generated with voice recognition software. I apologize for typographical errors.  Cloretta Ned  [facility] [address]  [address] [place], [place] [address] [phone]

## 2021-12-20 ENCOUNTER — Ambulatory Visit (INDEPENDENT_AMBULATORY_CARE_PROVIDER_SITE_OTHER): Payer: Medicare Other | Admitting: Urology

## 2021-12-20 ENCOUNTER — Encounter: Payer: Self-pay | Admitting: Urology

## 2021-12-20 VITALS — BP 119/71 | HR 70 | Ht 69.5 in | Wt 168.0 lb

## 2021-12-20 DIAGNOSIS — N401 Enlarged prostate with lower urinary tract symptoms: Secondary | ICD-10-CM | POA: Diagnosis not present

## 2021-12-20 DIAGNOSIS — N138 Other obstructive and reflux uropathy: Secondary | ICD-10-CM | POA: Diagnosis not present

## 2021-12-20 LAB — BLADDER SCAN AMB NON-IMAGING

## 2021-12-20 MED ORDER — TAMSULOSIN HCL 0.4 MG PO CAPS: 0.4000 mg | ORAL_CAPSULE | Freq: Every day | ORAL | 3 refills | Status: AC

## 2021-12-20 MED ORDER — DUTASTERIDE 0.5 MG PO CAPS
0.5000 mg | ORAL_CAPSULE | Freq: Every day | ORAL | 3 refills | Status: DC
Start: 2021-12-20 — End: 2022-12-09

## 2022-02-05 NOTE — Progress Notes (Signed)
02/07/2022 2:36 PM   Barry Hale 1945/04/25 [id number]  Referring provider: Jordan Likes, MD No address on file  Urological history: 1.  Urinary retention -2021  2. BPH w/LU TS -cysto (2018) - Prostate - R > L lateral lobe regrowth. No median lobe. - Patent/open bladder neck -prostate volume 110 grams -PSA (2022) 9.14 -prostate MRI (2022) - NED -I PSS *** -PVR *** mL -Tamsulosin 0.4 mg daily and dutasteride 0.5 mg daily  3. Elevated PSA -biopsy 2012 for PSA 7.6 - NED  No chief complaint on file.   HPI: Barry Hale is a 77 y.o. male who presents today for one month follow up after restarting his tamsulosin and dutasteride.    At his visit on 12/20/2021,  "My blasted prostate is blocked again."  He has been without his medications since August or September.  He is having dysuria, intermittency, hesitancy and feeling of incomplete bladder emptying.  He had to cath himself Thursday night, Friday night and Saturday night.  He states he got out a lot.   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 If you have any questions, ask your nurse or doctor.          amLODipine 10 MG  tablet Commonly known as: NORVASC Take 10 mg by mouth daily.   atorvastatin 40 MG tablet Commonly known as: LIPITOR Take 40 mg by mouth daily.   dutasteride 0.5 MG capsule Commonly known as: AVODART Take 1 capsule (0.5 mg total) by mouth daily.   lisinopril 40 MG tablet Commonly known as: ZESTRIL Take 40 mg by mouth daily.   potassium chloride 10 MEQ tablet Commonly known as: KLOR-CON Take by mouth.   tamsulosin 0.4 MG Caps capsule Commonly known as: FLOMAX Take 1 capsule (0.4 mg total) by mouth daily. TAKE 1 CAPSULE BY MOUTH DAILY AFTER SUPPER        Allergies: No Known Allergies  Family History: Family History  Problem Relation Age of Onset   Benign prostatic hyperplasia Other    Coronary artery disease Other    Prostate cancer Neg Hx    Kidney cancer Neg Hx    Bladder Cancer Neg Hx     Social History:  reports that he has been smoking pipe. He has never used smokeless tobacco. He reports current alcohol use of about 7.0 standard drinks of alcohol per week. He reports that he does not use drugs.  ROS: Pertinent ROS in HPI  Physical Exam: There were no vitals taken for this visit.  Constitutional:  Well nourished. Alert and  oriented, No acute distress. HEENT: [place] AT, moist mucus membranes.  Trachea midline Cardiovascular: No clubbing, cyanosis, or edema. Respiratory: Normal respiratory effort, no increased work of breathing. GU: No CVA tenderness.  No bladder fullness or masses.  Patient with circumcised/uncircumcised phallus. ***Foreskin easily retracted***  Urethral meatus is patent.  No penile discharge. No penile lesions or rashes. Scrotum without lesions, cysts, rashes and/or edema.  Testicles are located scrotally bilaterally. No masses are appreciated in the testicles. Left and right epididymis are normal. Rectal: Patient with  normal sphincter tone. Anus and perineum without scarring or rashes. No rectal masses are appreciated. Prostate is approximately ***  grams, *** nodules are appreciated. Seminal vesicles are normal. Neurologic: Grossly intact, no focal deficits, moving all 4 extremities. Psychiatric: Normal mood and affect.   Laboratory Data: N/A  Pertinent Imaging: ***  Assessment & Plan:    1. BPH with LU TS -***  2. Elevated PSA -discussed that his last PSA was elevated a 9.14 two years ago -We reviewed the implications of an elevated PSA and the uncertainty surrounding it. In general, a man's PSA increases with age and is produced by both normal and cancerous prostate tissue. -The differential diagnosis for elevated PSA includes BPH, prostate cancer, infection, recent intercourse/ejaculation, recent urethroscopic manipulation (foley placement/cystoscopy) or trauma, and prostatitis *** -Management of an elevated PSA can include observation or prostate biopsy and we discussed this in detail. Our goal is to detect clinically significant prostate cancers, and manage with either active surveillance, surgery, or radiation for localized disease. Risks of prostate biopsy include bleeding, infection (including life threatening sepsis), pain, and lower urinary symptoms. Hematuria, hematospermia, and blood in the stool are all common after biopsy and can persist up to 4 weeks ***   No follow-ups on file.  These notes generated with voice recognition software. I apologize for typographical errors.  Cloretta Ned  [facility] [address]  [address] [place], [place] [address] [phone]

## 2022-02-07 ENCOUNTER — Ambulatory Visit (INDEPENDENT_AMBULATORY_CARE_PROVIDER_SITE_OTHER): Payer: Medicare Other | Admitting: Urology

## 2022-02-07 ENCOUNTER — Ambulatory Visit: Payer: Medicare Other | Admitting: Urology

## 2022-02-07 ENCOUNTER — Encounter: Payer: Self-pay | Admitting: Urology

## 2022-02-07 VITALS — BP 105/60 | HR 75 | Ht 69.5 in | Wt 163.0 lb

## 2022-02-07 DIAGNOSIS — N138 Other obstructive and reflux uropathy: Secondary | ICD-10-CM | POA: Diagnosis not present

## 2022-02-07 DIAGNOSIS — N401 Enlarged prostate with lower urinary tract symptoms: Secondary | ICD-10-CM

## 2022-02-07 DIAGNOSIS — R972 Elevated prostate specific antigen [PSA]: Secondary | ICD-10-CM

## 2022-02-07 LAB — BLADDER SCAN AMB NON-IMAGING

## 2022-03-21 IMAGING — MR MR PROSTATE WO/W CM
56 series · 56 of 56 positions shown · IV contrast (gadavist)
Comparison: None.

CLINICAL DATA: Elevated PSA.

EXAM:
MR PROSTATE WITHOUT AND WITH CONTRAST
TECHNIQUE: Multiplanar multisequence MRI images were obtained of the pelvis
centered about the prostate. Pre and post contrast images were
obtained.
CONTRAST:  8mL GADAVIST GADOBUTROL 1 MMOL/ML IV SOLN

[Series 4: ax in&out whole · axial · 5.0mm · 0.74mm/px · 1 of 50 slices shown (1 of 2)]
[im 1/50]
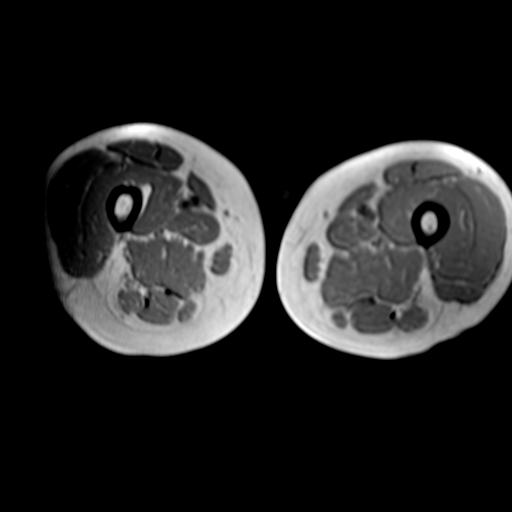

[Series 4: ax in&out whole · axial · 5.0mm · 0.74mm/px · 1 of 50 slices shown (2 of 2)]
[im 1/50]
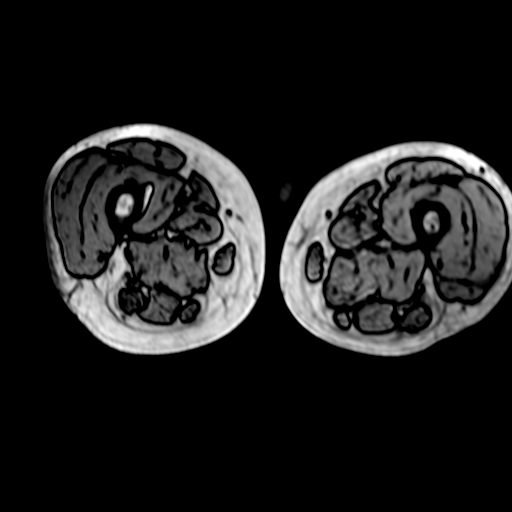

[Series 5: T2 · axial · 3.0mm · 0.56mm/px · 1 of 40 slices shown (1 of 3)]
[im 1/40]
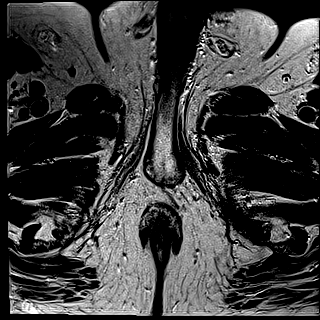

[Series 6: T2 · coronal · 3.0mm · 0.70mm/px · 1 of 38 slices shown (2 of 3)]
[im 1/38]
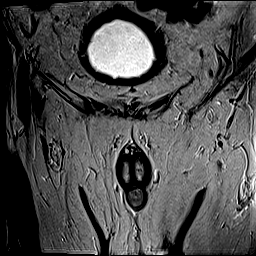

[Series 7: DWI · axial · 3.0mm · 0.86mm/px · 1 of 114 slices shown (1 of 3)]
[im 1/114]
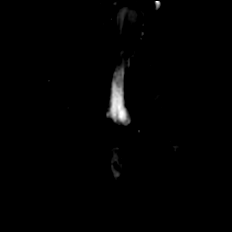

[Series 8: DWI · axial · 3.0mm · 0.86mm/px · 1 of 38 slices shown (2 of 3)]
[im 1/38]
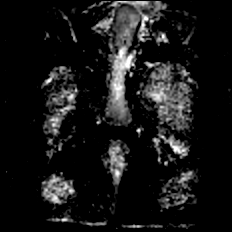

[Series 9: DWI · axial · 3.0mm · 0.86mm/px · 1 of 38 slices shown (3 of 3)]
[im 1/38]
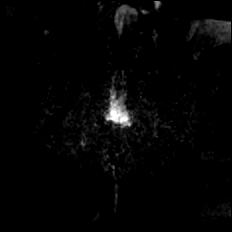

[Series 10: T2 · axial · 1.8mm · 1.04mm/px · 1 of 72 slices shown (3 of 3)]
[im 1/72]
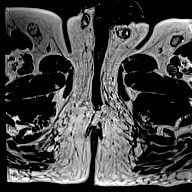

[Series 11: T1 · axial · 3.0mm · 1.15mm/px · 1 of 36 slices shown (1 of 48)]
[im 1/36]
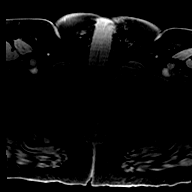

[Series 12: T1 · axial · 3.0mm · 1.15mm/px · 1 of 36 slices shown (2 of 48)]
[im 1/36]
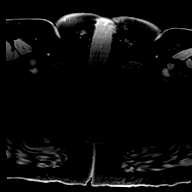

[Series 13: T1 · axial · 3.0mm · 1.15mm/px · 1 of 36 slices shown (3 of 48)]
[im 1/36]
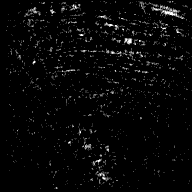

[Series 14: T1 · axial · 3.0mm · 1.15mm/px · 1 of 36 slices shown (4 of 48)]
[im 1/36]
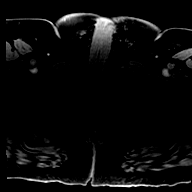

[Series 15: T1 · axial · 3.0mm · 1.15mm/px · 1 of 36 slices shown (5 of 48)]
[im 1/36]
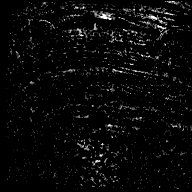

[Series 16: T1 · axial · 3.0mm · 1.15mm/px · 1 of 36 slices shown (6 of 48)]
[im 1/36]
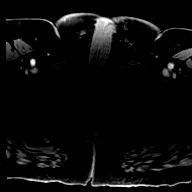

[Series 17: T1 · axial · 3.0mm · 1.15mm/px · 1 of 36 slices shown (7 of 48)]
[im 1/36]
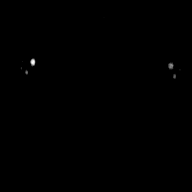

[Series 18: T1 · axial · 3.0mm · 1.15mm/px · 1 of 36 slices shown (8 of 48)]
[im 1/36]
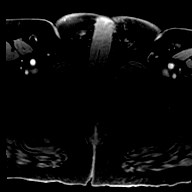

[Series 19: T1 · axial · 3.0mm · 1.15mm/px · 1 of 36 slices shown (9 of 48)]
[im 1/36]
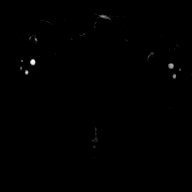

[Series 20: T1 · axial · 3.0mm · 1.15mm/px · 1 of 36 slices shown (10 of 48)]
[im 1/36]
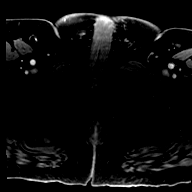

[Series 21: T1 · axial · 3.0mm · 1.15mm/px · 1 of 36 slices shown (11 of 48)]
[im 1/36]
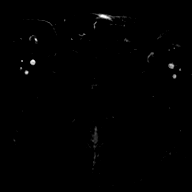

[Series 22: T1 · axial · 3.0mm · 1.15mm/px · 1 of 36 slices shown (12 of 48)]
[im 1/36]
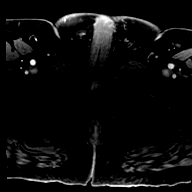

[Series 23: T1 · axial · 3.0mm · 1.15mm/px · 1 of 36 slices shown (13 of 48)]
[im 1/36]
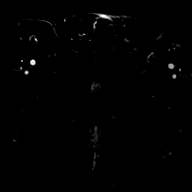

[Series 24: T1 · axial · 3.0mm · 1.15mm/px · 1 of 36 slices shown (14 of 48)]
[im 1/36]
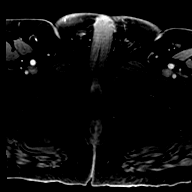

[Series 25: T1 · axial · 3.0mm · 1.15mm/px · 1 of 36 slices shown (15 of 48)]
[im 1/36]
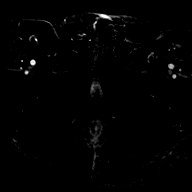

[Series 26: T1 · axial · 3.0mm · 1.15mm/px · 1 of 36 slices shown (16 of 48)]
[im 1/36]
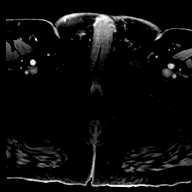

[Series 27: T1 · axial · 3.0mm · 1.15mm/px · 1 of 36 slices shown (17 of 48)]
[im 1/36]
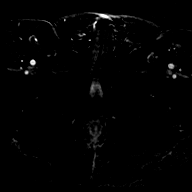

[Series 28: T1 · axial · 3.0mm · 1.15mm/px · 1 of 36 slices shown (18 of 48)]
[im 1/36]
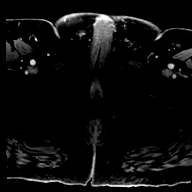

[Series 29: T1 · axial · 3.0mm · 1.15mm/px · 1 of 36 slices shown (19 of 48)]
[im 1/36]
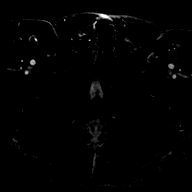

[Series 30: T1 · axial · 3.0mm · 1.15mm/px · 1 of 36 slices shown (20 of 48)]
[im 1/36]
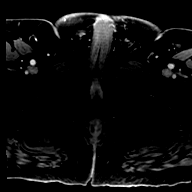

[Series 31: T1 · axial · 3.0mm · 1.15mm/px · 1 of 36 slices shown (21 of 48)]
[im 1/36]
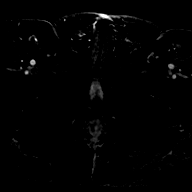

[Series 32: T1 · axial · 3.0mm · 1.15mm/px · 1 of 36 slices shown (22 of 48)]
[im 1/36]
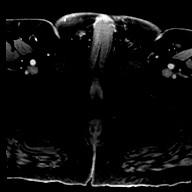

[Series 33: T1 · axial · 3.0mm · 1.15mm/px · 1 of 36 slices shown (23 of 48)]
[im 1/36]
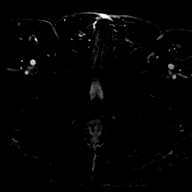

[Series 34: T1 · axial · 3.0mm · 1.15mm/px · 1 of 36 slices shown (24 of 48)]
[im 1/36]
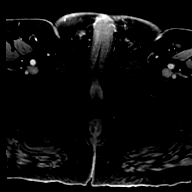

[Series 35: T1 · axial · 3.0mm · 1.15mm/px · 1 of 36 slices shown (25 of 48)]
[im 1/36]
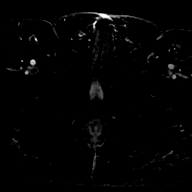

[Series 36: T1 · axial · 3.0mm · 1.15mm/px · 1 of 36 slices shown (26 of 48)]
[im 1/36]
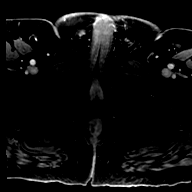

[Series 37: T1 · axial · 3.0mm · 1.15mm/px · 1 of 36 slices shown (27 of 48)]
[im 1/36]
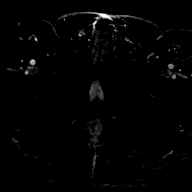

[Series 38: T1 · axial · 3.0mm · 1.15mm/px · 1 of 36 slices shown (28 of 48)]
[im 1/36]
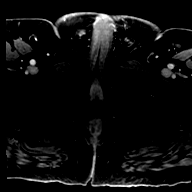

[Series 39: T1 · axial · 3.0mm · 1.15mm/px · 1 of 36 slices shown (29 of 48)]
[im 1/36]
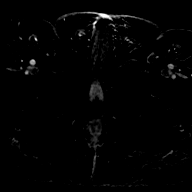

[Series 40: T1 · axial · 3.0mm · 1.15mm/px · 1 of 36 slices shown (30 of 48)]
[im 1/36]
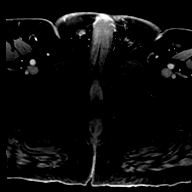

[Series 41: T1 · axial · 3.0mm · 1.15mm/px · 1 of 36 slices shown (31 of 48)]
[im 1/36]
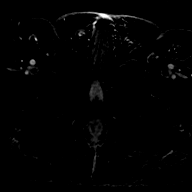

[Series 42: T1 · axial · 3.0mm · 1.15mm/px · 1 of 36 slices shown (32 of 48)]
[im 1/36]
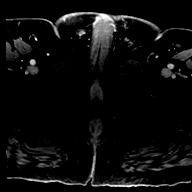

[Series 43: T1 · axial · 3.0mm · 1.15mm/px · 1 of 36 slices shown (33 of 48)]
[im 1/36]
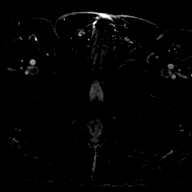

[Series 44: T1 · axial · 3.0mm · 1.15mm/px · 1 of 36 slices shown (34 of 48)]
[im 1/36]
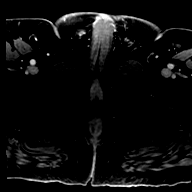

[Series 45: T1 · axial · 3.0mm · 1.15mm/px · 1 of 36 slices shown (35 of 48)]
[im 1/36]
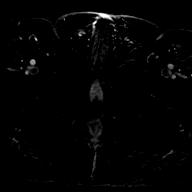

[Series 46: T1 · axial · 3.0mm · 1.15mm/px · 1 of 36 slices shown (36 of 48)]
[im 1/36]
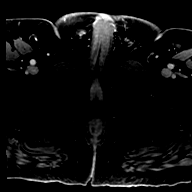

[Series 47: T1 · axial · 3.0mm · 1.15mm/px · 1 of 36 slices shown (37 of 48)]
[im 1/36]
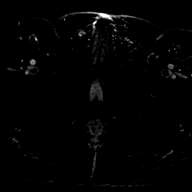

[Series 48: T1 · axial · 3.0mm · 1.15mm/px · 1 of 36 slices shown (38 of 48)]
[im 1/36]
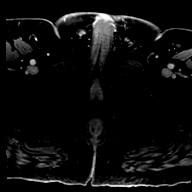

[Series 49: T1 · axial · 3.0mm · 1.15mm/px · 1 of 36 slices shown (39 of 48)]
[im 1/36]
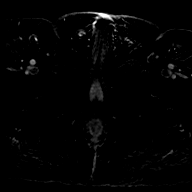

[Series 50: T1 · axial · 3.0mm · 1.15mm/px · 1 of 36 slices shown (40 of 48)]
[im 1/36]
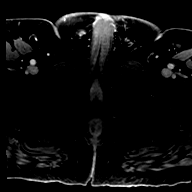

[Series 51: T1 · axial · 3.0mm · 1.15mm/px · 1 of 36 slices shown (41 of 48)]
[im 1/36]
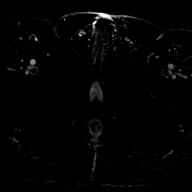

[Series 52: T1 · axial · 3.0mm · 1.15mm/px · 1 of 36 slices shown (42 of 48)]
[im 1/36]
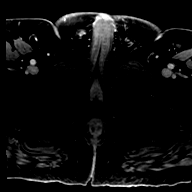

[Series 53: T1 · axial · 3.0mm · 1.15mm/px · 1 of 36 slices shown (43 of 48)]
[im 1/36]
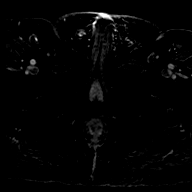

[Series 54: T1 · axial · 3.0mm · 1.15mm/px · 1 of 36 slices shown (44 of 48)]
[im 1/36]
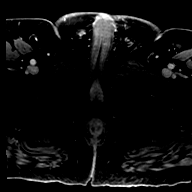

[Series 55: T1 · axial · 3.0mm · 1.15mm/px · 1 of 36 slices shown (45 of 48)]
[im 1/36]
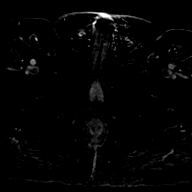

[Series 56: T1 · axial · 3.0mm · 1.15mm/px · 1 of 36 slices shown (46 of 48)]
[im 1/36]
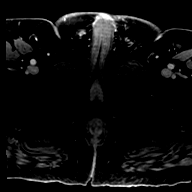

[Series 57: T1 · axial · 3.0mm · 1.15mm/px · 1 of 36 slices shown (47 of 48)]
[im 1/36]
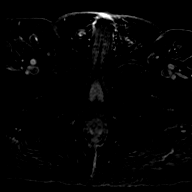

[Series 58: T1 · axial · 3.0mm · 1.15mm/px · 1 of 36 slices shown (48 of 48)]
[im 1/36]
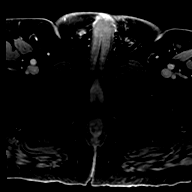

[56 of 56 positions shown; findings below may reference images not displayed]

FINDINGS: Prostate:

-- Peripheral Zone: Diffuse thinning due to central gland
enlargement. No focal lesion seen on ADC and high b-value DWI
sequences.

-- Transition/Central Zone: Moderately enlarged with circumscribed
BPH nodules noted, but no suspicious nodules with obscured or
non-circumscribed margins seen.

-- Measurements/Volume:  6.6 x 5.5 x 6.0 cm (volume = 110 cm^3)

Transcapsular spread:  Absent

Seminal vesicle involvement:  Absent

Neurovascular bundle involvement:  Absent

Pelvic adenopathy: None visualized

Bone metastasis: None visualized

Other: Sigmoid diverticulosis noted. Diffuse bladder wall thickening
and trabeculation, consistent with chronic bladder outlet
obstruction.
IMPRESSION: No radiographic evidence of high-grade prostate carcinoma. PI-RADS
1: Very Low (clinically significant cancer is highly unlikely to be
present)

## 2022-12-09 ENCOUNTER — Other Ambulatory Visit: Payer: Self-pay

## 2022-12-09 DIAGNOSIS — N401 Enlarged prostate with lower urinary tract symptoms: Secondary | ICD-10-CM

## 2022-12-09 MED ORDER — DUTASTERIDE 0.5 MG PO CAPS: 0.5000 mg | ORAL_CAPSULE | Freq: Every day | ORAL | 0 refills | Status: AC

## 2022-12-30 ENCOUNTER — Encounter: Payer: Self-pay | Admitting: Emergency Medicine

## 2022-12-30 ENCOUNTER — Emergency Department
Admission: EM | Admit: 2022-12-30 | Discharge: 2022-12-30 | Disposition: A | Discharge status: Home / Self Care | Payer: Medicare Other | Attending: Emergency Medicine | Admitting: Emergency Medicine

## 2022-12-30 ENCOUNTER — Other Ambulatory Visit: Payer: Self-pay

## 2022-12-30 DIAGNOSIS — R339 Retention of urine, unspecified: Secondary | ICD-10-CM

## 2022-12-30 DIAGNOSIS — N390 Urinary tract infection, site not specified: Secondary | ICD-10-CM | POA: Insufficient documentation

## 2022-12-30 DIAGNOSIS — I10 Essential (primary) hypertension: Secondary | ICD-10-CM | POA: Diagnosis not present

## 2022-12-30 LAB — URINALYSIS, ROUTINE W REFLEX MICROSCOPIC
Bilirubin Urine: NEGATIVE
Glucose, UA: NEGATIVE mg/dL
Ketones, ur: NEGATIVE mg/dL
Nitrite: NEGATIVE
Protein, ur: 100 mg/dL — AB
RBC / HPF: >50 RBC/hpf (ref 0–5)
Specific Gravity, Urine: 1.023 (ref 1.005–1.030)
Squamous Epithelial / HPF: 0 /[HPF] (ref 0–5)
WBC, UA: >50 WBC/hpf (ref 0–5)
pH: 5 (ref 5.0–8.0)

## 2022-12-30 MED ORDER — AMOXICILLIN-POT CLAVULANATE 875-125 MG PO TABS: 1.0000 | ORAL_TABLET | Freq: Two times a day (BID) | ORAL | 0 refills | Status: AC

## 2022-12-30 NOTE — ED Provider Notes (Signed)
[facility] Provider Note    Event Date/Time   First MD Initiated Contact with Patient 12/30/22 [id number]     (approximate)   History   Urinary Retention   HPI  Barry Hale is a 77 y.o. male with history of hypertension, CVA, BPH presents emergency department requesting a Foley catheter.  Patient states he has been self cathing for about a week.  Having to self cath every 4 hours and it is becoming a little painful.  Would rather have a Foley catheter as he is getting quite a bit of urine output.  Patient has a urologist but needs to make an appointment.  Denies fever or chills.      Physical Exam   Triage Vital Signs: ED Triage Vitals  Encounter Vitals Group     BP 12/30/22 0842 130/73     Systolic BP Percentile --      Diastolic BP Percentile --      Pulse Rate 12/30/22 0842 82     Resp 12/30/22 0842 16     Temp 12/30/22 0842 98.3 F (36.8 C)     Temp Source 12/30/22 0842 Oral     SpO2 12/30/22 0842 95 %     Weight 12/30/22 0849 163 lb (73.9 kg)     Height 12/30/22 0849 5\' 9"  (1.753 m)     Head Circumference --      Peak Flow --      Pain Score 12/30/22 0844 0     Pain Loc --      Pain Education --      Exclude from Growth Chart --     Most recent vital signs: Vitals:   12/30/22 0842  BP: 130/73  Pulse: 82  Resp: 16  Temp: 98.3 F (36.8 C)  SpO2: 95%     General: Awake, no distress.   CV:  Good peripheral perfusion. regular rate and  rhythm Resp:  Normal effort.  Abd:  No distention.   Other:      ED Results / Procedures / Treatments   Labs (all labs ordered are listed, but only abnormal results are displayed) Labs Reviewed  URINALYSIS, ROUTINE W REFLEX MICROSCOPIC - Abnormal; Notable for the following components:      Result Value   Color, Urine AMBER (*)    APPearance CLOUDY (*)    Hgb urine dipstick LARGE (*)    Protein, ur 100 (*)    Leukocytes,Ua MODERATE (*)    Bacteria, UA RARE (*)    All other components  within normal limits     EKG     RADIOLOGY     PROCEDURES:   Procedures   MEDICATIONS ORDERED IN ED: Medications - No data to display   IMPRESSION / MDM / ASSESSMENT AND PLAN / ED COURSE  I reviewed the triage vital signs and the nursing notes.                              Differential diagnosis includes, but is not limited to, nary retention, BPH, obstruction, Foley catheter request  Patient's presentation is most consistent with acute complicated illness / injury requiring diagnostic workup.   Since patient has been reusing his self caths we will go ahead and order a UA to ensure patient does not have an infection   UA with moderate leuks.  Will send antibiotic to patient's pharmacy.  Did call notified.  He is in  agreement treatment plan.  He had been discharged stable condition.   FINAL CLINICAL IMPRESSION(S) / ED DIAGNOSES   Final diagnoses:  Urinary retention  Acute UTI     Rx / DC Orders   ED Discharge Orders          Ordered    amoxicillin-clavulanate (AUGMENTIN) 875-125 MG tablet  2 times daily        12/30/22 1134             Note:  This document was prepared using Dragon voice recognition software and may include unintentional dictation errors.    Faythe Ghee, PA-C 12/30/22 1145    Jene Every, MD 12/30/22 1146

## 2022-12-30 NOTE — ED Triage Notes (Signed)
Pt states that he self cathing since Monday, pt is here because he would like an indwelling foley, states each time he caths his output he feels is enough to feel a bag, pt appears to have an irreg heart beat on the monitor in triage, states that he has a pacemaker but is unaware of if his rhythm  is irreg

## 2023-03-08 ENCOUNTER — Other Ambulatory Visit: Payer: Self-pay | Admitting: Urology

## 2023-03-08 DIAGNOSIS — N138 Other obstructive and reflux uropathy: Secondary | ICD-10-CM
